# Patient Record
Sex: Female | Born: 1970 | Race: White | Hispanic: No | Marital: Married | Smoking: Former smoker
Health system: Southern US, Community
[De-identification: ages and names within clinical notes are randomized; demographics above are authoritative.]

## PROBLEM LIST (undated history)

## (undated) DIAGNOSIS — J069 Acute upper respiratory infection, unspecified: Secondary | ICD-10-CM

## (undated) DIAGNOSIS — D563 Thalassemia minor: Secondary | ICD-10-CM

## (undated) DIAGNOSIS — J329 Chronic sinusitis, unspecified: Secondary | ICD-10-CM

## (undated) DIAGNOSIS — M069 Rheumatoid arthritis, unspecified: Secondary | ICD-10-CM

## (undated) DIAGNOSIS — F419 Anxiety disorder, unspecified: Secondary | ICD-10-CM

## (undated) DIAGNOSIS — R002 Palpitations: Secondary | ICD-10-CM

## (undated) DIAGNOSIS — M2550 Pain in unspecified joint: Secondary | ICD-10-CM

## (undated) DIAGNOSIS — I10 Essential (primary) hypertension: Secondary | ICD-10-CM

## (undated) DIAGNOSIS — D649 Anemia, unspecified: Secondary | ICD-10-CM

## (undated) HISTORY — PX: THERAPEUTIC ABORTION: SHX798

## (undated) HISTORY — DX: Palpitations: R00.2

## (undated) HISTORY — DX: Anxiety disorder, unspecified: F41.9

## (undated) HISTORY — DX: Chronic sinusitis, unspecified: J32.9

## (undated) HISTORY — PX: WISDOM TOOTH EXTRACTION: SHX21

## (undated) HISTORY — DX: Acute upper respiratory infection, unspecified: J06.9

## (undated) HISTORY — PX: TONSILLECTOMY: SUR1361

## (undated) HISTORY — PX: KIDNEY SURGERY: SHX687

## (undated) HISTORY — DX: Pain in unspecified joint: M25.50

---

## 1998-01-21 ENCOUNTER — Other Ambulatory Visit: Admission: RE | Admit: 1998-01-21 | Discharge: 1998-01-21 | Payer: Self-pay | Admitting: Obstetrics and Gynecology

## 1998-02-25 ENCOUNTER — Ambulatory Visit (HOSPITAL_COMMUNITY): Admission: RE | Admit: 1998-02-25 | Discharge: 1998-02-25 | Payer: Self-pay | Admitting: Urology

## 1998-02-25 ENCOUNTER — Encounter: Payer: Self-pay | Admitting: Urology

## 1998-12-31 ENCOUNTER — Encounter: Payer: Self-pay | Admitting: *Deleted

## 1998-12-31 ENCOUNTER — Ambulatory Visit (HOSPITAL_COMMUNITY): Admission: RE | Admit: 1998-12-31 | Discharge: 1998-12-31 | Payer: Self-pay | Admitting: *Deleted

## 1999-02-04 ENCOUNTER — Other Ambulatory Visit: Admission: RE | Admit: 1999-02-04 | Discharge: 1999-02-04 | Payer: Self-pay | Admitting: Obstetrics and Gynecology

## 2000-02-03 ENCOUNTER — Other Ambulatory Visit: Admission: RE | Admit: 2000-02-03 | Discharge: 2000-02-03 | Payer: Self-pay | Admitting: Obstetrics and Gynecology

## 2001-03-03 ENCOUNTER — Other Ambulatory Visit: Admission: RE | Admit: 2001-03-03 | Discharge: 2001-03-03 | Payer: Self-pay | Admitting: Obstetrics and Gynecology

## 2002-03-26 ENCOUNTER — Other Ambulatory Visit: Admission: RE | Admit: 2002-03-26 | Discharge: 2002-03-26 | Payer: Self-pay | Admitting: Obstetrics and Gynecology

## 2002-09-14 ENCOUNTER — Ambulatory Visit (HOSPITAL_COMMUNITY): Admission: RE | Admit: 2002-09-14 | Discharge: 2002-09-14 | Payer: Self-pay | Admitting: Urology

## 2002-09-14 ENCOUNTER — Encounter: Payer: Self-pay | Admitting: Urology

## 2003-01-11 ENCOUNTER — Emergency Department (HOSPITAL_COMMUNITY): Admission: EM | Admit: 2003-01-11 | Discharge: 2003-01-11 | Payer: Self-pay | Admitting: Emergency Medicine

## 2003-01-14 ENCOUNTER — Emergency Department (HOSPITAL_COMMUNITY): Admission: EM | Admit: 2003-01-14 | Discharge: 2003-01-14 | Payer: Self-pay

## 2003-01-18 ENCOUNTER — Emergency Department (HOSPITAL_COMMUNITY): Admission: EM | Admit: 2003-01-18 | Discharge: 2003-01-18 | Payer: Self-pay | Admitting: Emergency Medicine

## 2003-01-25 ENCOUNTER — Emergency Department (HOSPITAL_COMMUNITY): Admission: EM | Admit: 2003-01-25 | Discharge: 2003-01-25 | Payer: Self-pay | Admitting: Emergency Medicine

## 2003-02-01 ENCOUNTER — Emergency Department (HOSPITAL_COMMUNITY): Admission: EM | Admit: 2003-02-01 | Discharge: 2003-02-01 | Payer: Self-pay | Admitting: Emergency Medicine

## 2003-04-19 ENCOUNTER — Other Ambulatory Visit: Admission: RE | Admit: 2003-04-19 | Discharge: 2003-04-19 | Payer: Self-pay | Admitting: Obstetrics and Gynecology

## 2004-05-28 ENCOUNTER — Other Ambulatory Visit: Admission: RE | Admit: 2004-05-28 | Discharge: 2004-05-28 | Payer: Self-pay | Admitting: Obstetrics and Gynecology

## 2005-06-23 ENCOUNTER — Other Ambulatory Visit: Admission: RE | Admit: 2005-06-23 | Discharge: 2005-06-23 | Payer: Self-pay | Admitting: Obstetrics and Gynecology

## 2007-08-30 ENCOUNTER — Encounter: Admission: RE | Admit: 2007-08-30 | Discharge: 2007-08-30 | Payer: Self-pay | Admitting: Obstetrics and Gynecology

## 2008-03-01 ENCOUNTER — Encounter: Admission: RE | Admit: 2008-03-01 | Discharge: 2008-03-01 | Payer: Self-pay | Admitting: Obstetrics and Gynecology

## 2010-02-28 ENCOUNTER — Inpatient Hospital Stay (HOSPITAL_COMMUNITY): Admission: AD | Admit: 2010-02-28 | Discharge: 2010-02-28 | Payer: Self-pay | Admitting: Obstetrics and Gynecology

## 2010-03-14 ENCOUNTER — Inpatient Hospital Stay (HOSPITAL_COMMUNITY): Admission: AD | Admit: 2010-03-14 | Discharge: 2010-03-18 | Payer: Self-pay | Admitting: Obstetrics and Gynecology

## 2010-03-15 ENCOUNTER — Encounter (INDEPENDENT_AMBULATORY_CARE_PROVIDER_SITE_OTHER): Payer: Self-pay | Admitting: Obstetrics and Gynecology

## 2010-03-16 ENCOUNTER — Encounter
Admission: RE | Admit: 2010-03-16 | Discharge: 2010-04-15 | Payer: Self-pay | Source: Home / Self Care | Attending: Obstetrics and Gynecology | Admitting: Obstetrics and Gynecology

## 2010-04-16 ENCOUNTER — Encounter
Admission: RE | Admit: 2010-04-16 | Discharge: 2010-05-16 | Payer: Self-pay | Source: Home / Self Care | Attending: Obstetrics and Gynecology | Admitting: Obstetrics and Gynecology

## 2010-05-17 ENCOUNTER — Encounter
Admission: RE | Admit: 2010-05-17 | Discharge: 2010-06-02 | Payer: Self-pay | Source: Home / Self Care | Attending: Obstetrics and Gynecology | Admitting: Obstetrics and Gynecology

## 2010-05-24 ENCOUNTER — Encounter: Payer: Self-pay | Admitting: Obstetrics and Gynecology

## 2010-06-17 ENCOUNTER — Encounter (HOSPITAL_COMMUNITY)
Admission: RE | Admit: 2010-06-17 | Discharge: 2010-06-17 | Disposition: A | Discharge status: Home / Self Care | Payer: Self-pay | Source: Ambulatory Visit | Attending: Obstetrics and Gynecology | Admitting: Obstetrics and Gynecology

## 2010-06-17 DIAGNOSIS — O923 Agalactia: Secondary | ICD-10-CM | POA: Insufficient documentation

## 2010-07-14 LAB — CBC
HCT: 25.2 % — ABNORMAL LOW (ref 36.0–46.0)
HCT: 25.8 % — ABNORMAL LOW (ref 36.0–46.0)
Hemoglobin: 8.1 g/dL — ABNORMAL LOW (ref 12.0–15.0)
Hemoglobin: 8.3 g/dL — ABNORMAL LOW (ref 12.0–15.0)
MCH: 21.7 pg — ABNORMAL LOW (ref 26.0–34.0)
MCHC: 32.2 g/dL (ref 30.0–36.0)
RBC: 3.69 MIL/uL — ABNORMAL LOW (ref 3.87–5.11)
RBC: 3.81 MIL/uL — ABNORMAL LOW (ref 3.87–5.11)
RDW: 15.1 % (ref 11.5–15.5)
RDW: 15.6 % — ABNORMAL HIGH (ref 11.5–15.5)
WBC: 10.2 10*3/uL (ref 4.0–10.5)
WBC: 12.3 10*3/uL — ABNORMAL HIGH (ref 4.0–10.5)

## 2010-07-14 LAB — COMPREHENSIVE METABOLIC PANEL
ALT: 16 U/L (ref 0–35)
AST: 29 U/L (ref 0–37)
Albumin: 3 g/dL — ABNORMAL LOW (ref 3.5–5.2)
Alkaline Phosphatase: 126 U/L — ABNORMAL HIGH (ref 39–117)
Calcium: 9.8 mg/dL (ref 8.4–10.5)
GFR calc Af Amer: >60 mL/min (ref >60)
Potassium: 4.5 mEq/L (ref 3.5–5.1)
Sodium: 135 mEq/L (ref 135–145)
Total Protein: 6.2 g/dL (ref 6.0–8.3)

## 2010-07-14 LAB — ABO/RH: ABO/RH(D): O POS

## 2010-07-18 ENCOUNTER — Encounter (HOSPITAL_COMMUNITY)
Admission: RE | Admit: 2010-07-18 | Discharge: 2010-07-18 | Disposition: A | Discharge status: Home / Self Care | Payer: Self-pay | Source: Ambulatory Visit | Attending: Obstetrics and Gynecology | Admitting: Obstetrics and Gynecology

## 2010-07-18 DIAGNOSIS — O923 Agalactia: Secondary | ICD-10-CM | POA: Insufficient documentation

## 2010-08-18 ENCOUNTER — Encounter (HOSPITAL_COMMUNITY)
Admission: RE | Admit: 2010-08-18 | Discharge: 2010-08-18 | Disposition: A | Discharge status: Home / Self Care | Payer: Self-pay | Source: Ambulatory Visit | Attending: Obstetrics and Gynecology | Admitting: Obstetrics and Gynecology

## 2010-08-18 DIAGNOSIS — O923 Agalactia: Secondary | ICD-10-CM | POA: Insufficient documentation

## 2010-09-18 ENCOUNTER — Encounter (HOSPITAL_COMMUNITY)
Admission: RE | Admit: 2010-09-18 | Discharge: 2010-09-18 | Disposition: A | Discharge status: Home / Self Care | Payer: Self-pay | Source: Ambulatory Visit | Attending: Obstetrics and Gynecology | Admitting: Obstetrics and Gynecology

## 2010-09-18 DIAGNOSIS — O923 Agalactia: Secondary | ICD-10-CM | POA: Insufficient documentation

## 2010-10-19 ENCOUNTER — Encounter (HOSPITAL_COMMUNITY)
Admission: RE | Admit: 2010-10-19 | Discharge: 2010-10-19 | Disposition: A | Discharge status: Home / Self Care | Payer: Self-pay | Source: Ambulatory Visit | Attending: Obstetrics and Gynecology | Admitting: Obstetrics and Gynecology

## 2010-10-19 DIAGNOSIS — O923 Agalactia: Secondary | ICD-10-CM | POA: Insufficient documentation

## 2010-11-19 ENCOUNTER — Encounter (HOSPITAL_COMMUNITY)
Admission: RE | Admit: 2010-11-19 | Discharge: 2010-11-19 | Disposition: A | Discharge status: Home / Self Care | Payer: Self-pay | Source: Ambulatory Visit | Attending: Obstetrics and Gynecology | Admitting: Obstetrics and Gynecology

## 2010-11-19 DIAGNOSIS — O923 Agalactia: Secondary | ICD-10-CM | POA: Insufficient documentation

## 2010-12-20 ENCOUNTER — Encounter (HOSPITAL_COMMUNITY)
Admission: RE | Admit: 2010-12-20 | Discharge: 2010-12-20 | Disposition: A | Discharge status: Home / Self Care | Payer: Self-pay | Source: Ambulatory Visit | Attending: Obstetrics and Gynecology | Admitting: Obstetrics and Gynecology

## 2010-12-20 DIAGNOSIS — O923 Agalactia: Secondary | ICD-10-CM | POA: Insufficient documentation

## 2011-01-20 ENCOUNTER — Encounter (HOSPITAL_COMMUNITY)
Admission: RE | Admit: 2011-01-20 | Discharge: 2011-01-20 | Disposition: A | Discharge status: Home / Self Care | Payer: Self-pay | Source: Ambulatory Visit | Attending: Obstetrics and Gynecology | Admitting: Obstetrics and Gynecology

## 2011-01-20 DIAGNOSIS — O923 Agalactia: Secondary | ICD-10-CM | POA: Insufficient documentation

## 2011-02-20 ENCOUNTER — Encounter (HOSPITAL_COMMUNITY)
Admission: RE | Admit: 2011-02-20 | Discharge: 2011-02-20 | Disposition: A | Discharge status: Home / Self Care | Payer: Self-pay | Source: Ambulatory Visit | Attending: Obstetrics and Gynecology | Admitting: Obstetrics and Gynecology

## 2011-02-20 DIAGNOSIS — O923 Agalactia: Secondary | ICD-10-CM | POA: Insufficient documentation

## 2011-03-23 ENCOUNTER — Encounter (HOSPITAL_COMMUNITY)
Admission: RE | Admit: 2011-03-23 | Discharge: 2011-03-23 | Disposition: A | Discharge status: Home / Self Care | Payer: Self-pay | Source: Ambulatory Visit | Attending: Obstetrics and Gynecology | Admitting: Obstetrics and Gynecology

## 2011-03-23 DIAGNOSIS — O923 Agalactia: Secondary | ICD-10-CM | POA: Insufficient documentation

## 2011-04-23 ENCOUNTER — Encounter (HOSPITAL_COMMUNITY)
Admission: RE | Admit: 2011-04-23 | Discharge: 2011-04-23 | Disposition: A | Discharge status: Home / Self Care | Payer: Self-pay | Source: Ambulatory Visit | Attending: Obstetrics and Gynecology | Admitting: Obstetrics and Gynecology

## 2011-04-23 DIAGNOSIS — O923 Agalactia: Secondary | ICD-10-CM | POA: Insufficient documentation

## 2011-05-24 ENCOUNTER — Encounter (HOSPITAL_COMMUNITY)
Admission: RE | Admit: 2011-05-24 | Discharge: 2011-05-24 | Disposition: A | Discharge status: Home / Self Care | Payer: Self-pay | Source: Ambulatory Visit | Attending: Obstetrics and Gynecology | Admitting: Obstetrics and Gynecology

## 2011-05-24 DIAGNOSIS — O923 Agalactia: Secondary | ICD-10-CM | POA: Insufficient documentation

## 2011-06-24 ENCOUNTER — Encounter (HOSPITAL_COMMUNITY)
Admission: RE | Admit: 2011-06-24 | Discharge: 2011-06-24 | Disposition: A | Discharge status: Home / Self Care | Payer: Self-pay | Source: Ambulatory Visit | Attending: Obstetrics and Gynecology | Admitting: Obstetrics and Gynecology

## 2011-06-24 DIAGNOSIS — O923 Agalactia: Secondary | ICD-10-CM | POA: Insufficient documentation

## 2011-07-24 ENCOUNTER — Encounter (HOSPITAL_COMMUNITY)
Admission: RE | Admit: 2011-07-24 | Discharge: 2011-07-24 | Disposition: A | Discharge status: Home / Self Care | Payer: Self-pay | Source: Ambulatory Visit | Attending: Obstetrics and Gynecology | Admitting: Obstetrics and Gynecology

## 2011-07-24 DIAGNOSIS — O923 Agalactia: Secondary | ICD-10-CM | POA: Insufficient documentation

## 2011-12-13 ENCOUNTER — Encounter: Payer: Self-pay | Admitting: *Deleted

## 2011-12-14 ENCOUNTER — Encounter: Payer: Self-pay | Admitting: *Deleted

## 2011-12-14 ENCOUNTER — Encounter: Payer: Self-pay | Admitting: Cardiovascular Disease

## 2011-12-14 DIAGNOSIS — J069 Acute upper respiratory infection, unspecified: Secondary | ICD-10-CM | POA: Insufficient documentation

## 2011-12-14 DIAGNOSIS — F419 Anxiety disorder, unspecified: Secondary | ICD-10-CM | POA: Insufficient documentation

## 2011-12-14 DIAGNOSIS — J329 Chronic sinusitis, unspecified: Secondary | ICD-10-CM | POA: Insufficient documentation

## 2011-12-14 DIAGNOSIS — M2550 Pain in unspecified joint: Secondary | ICD-10-CM | POA: Insufficient documentation

## 2011-12-14 DIAGNOSIS — R002 Palpitations: Secondary | ICD-10-CM | POA: Insufficient documentation

## 2011-12-15 ENCOUNTER — Institutional Professional Consult (permissible substitution): Payer: Self-pay | Admitting: Cardiovascular Disease

## 2012-01-06 ENCOUNTER — Ambulatory Visit (INDEPENDENT_AMBULATORY_CARE_PROVIDER_SITE_OTHER): Payer: 59 | Admitting: Cardiovascular Disease

## 2012-01-06 ENCOUNTER — Encounter: Payer: Self-pay | Admitting: Cardiovascular Disease

## 2012-01-06 VITALS — BP 160/104 | HR 100 | Ht 68.0 in | Wt 165.8 lb

## 2012-01-06 DIAGNOSIS — F419 Anxiety disorder, unspecified: Secondary | ICD-10-CM

## 2012-01-06 DIAGNOSIS — F411 Generalized anxiety disorder: Secondary | ICD-10-CM

## 2012-01-06 DIAGNOSIS — R002 Palpitations: Secondary | ICD-10-CM

## 2012-01-06 MED ORDER — PROPRANOLOL HCL 10 MG PO TABS: 10.0000 mg | ORAL_TABLET | Freq: Every day | ORAL | Status: AC | PRN

## 2012-01-06 NOTE — Progress Notes (Signed)
Patient ID: Dominique Harvey, female   DOB: 09/30/1970, 41 y.o.   MRN: [id number] Anxious 41 yo referred by palpitations.  History of panic attacks.  Husband works Designer, jewellery and is gone a month at a time.  Last month after he left she had increased palpitations.  No presynocpe.  Flip flops no sustained rapid episodes.  Has been on Effexor for a long time.  PRN xanax.  She gets anxious and then her body gets "jacked up".  Mom has MVP.  She has not been told about any murmurs.  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  No dyspnea, chest pain or syncope.  Denies drugs or excess cafeinne  ROS: Denies fever, malais, weight loss, blurry vision, decreased visual acuity, cough, sputum, SOB, hemoptysis, pleuritic pain, palpitaitons, heartburn, abdominal pain, melena, lower extremity edema, claudication, or rash.  All other systems reviewed and negative   General: Affect appropriate Healthy:  appears stated age HEENT: normal Neck supple with no adenopathy JVP normal no bruits no thyromegaly Lungs clear with no wheezing and good diaphragmatic motion Heart:  S1/S2 no murmur,rub, gallop or click PMI normal Abdomen: benighn, BS positve, no tenderness, no AAA no bruit.  No HSM or HJR Distal pulses intact with no bruits No edema Neuro non-focal Skin warm and dry No muscular weakness  Medications Current Outpatient Prescriptions  Medication Sig Dispense Refill  . ALPRAZolam (XANAX) 0.5 MG tablet Take 0.25 mg by mouth as needed.       . cetirizine (ZYRTEC) 10 MG tablet Take 10 mg by mouth daily.      . Multiple Vitamin (MULTIVITAMIN) capsule Take 1 capsule by mouth daily.      . valACYclovir (VALTREX) 1000 MG tablet Take 1,000 mg by mouth as directed.      . venlafaxine (EFFEXOR) 75 MG tablet 3 tabs po qd      . propranolol (INDERAL) 10 MG tablet Take 1 tablet (10 mg total) by mouth daily as needed.  30 tablet  3    Allergies Review of patient's  allergies indicates no known allergies.  Family History: Family History  Problem Relation Age of Onset  . Mitral valve prolapse Mother   . Atrial fibrillation Mother   . Cancer      `  . Hypertension      Social History: History   Social History  . Marital Status: Married    Spouse Name: N/A    Number of Children: N/A  . Years of Education: N/A   Occupational History  . Not on file.   Social History Main Topics  . Smoking status: Former Smoker -- 1.0 packs/day for 5 years    Types: Cigarettes    Quit date: 05/03/1992  . Smokeless tobacco: Not on file  . Alcohol Use: Not on file  . Drug Use: Not on file  . Sexually Active: Not on file   Other Topics Concern  . Not on file   Social History Narrative  . No narrative on file    Electrocardiogram:  NSR normal ECG  Assessment and Plan

## 2012-01-06 NOTE — Assessment & Plan Note (Signed)
Benign with normal exam and ECG  PRN Inderal for somatic component.   No need for any other cardiac testing

## 2012-01-06 NOTE — Patient Instructions (Signed)
Your physician recommends that you schedule a follow-up appointment in: AS NEEDED Your physician has recommended you make the following change in your medication: PROPRANOLOL 10 MG DAILY AS NEEDED FOR PALPITATION

## 2012-01-06 NOTE — Assessment & Plan Note (Signed)
PRN xanax.  Consider stopping Effexor due to sympathomimitic effect and switch to other med

## 2012-01-13 ENCOUNTER — Encounter: Payer: Self-pay | Admitting: Cardiovascular Disease

## 2012-08-25 ENCOUNTER — Other Ambulatory Visit: Payer: Self-pay | Admitting: Obstetrics and Gynecology

## 2015-01-17 ENCOUNTER — Other Ambulatory Visit: Payer: Self-pay

## 2015-01-17 DIAGNOSIS — Z1231 Encounter for screening mammogram for malignant neoplasm of breast: Secondary | ICD-10-CM

## 2015-01-22 ENCOUNTER — Ambulatory Visit: Payer: Self-pay

## 2015-02-20 ENCOUNTER — Ambulatory Visit: Payer: Self-pay

## 2015-02-28 ENCOUNTER — Ambulatory Visit
Admission: RE | Admit: 2015-02-28 | Discharge: 2015-02-28 | Disposition: A | Discharge status: Home / Self Care | Payer: Commercial Managed Care - HMO | Source: Ambulatory Visit

## 2015-02-28 DIAGNOSIS — Z1231 Encounter for screening mammogram for malignant neoplasm of breast: Secondary | ICD-10-CM

## 2015-05-07 ENCOUNTER — Other Ambulatory Visit: Payer: Self-pay | Admitting: Gastroenterology

## 2015-05-07 DIAGNOSIS — R7989 Other specified abnormal findings of blood chemistry: Secondary | ICD-10-CM

## 2015-05-07 DIAGNOSIS — R945 Abnormal results of liver function studies: Principal | ICD-10-CM

## 2015-05-15 ENCOUNTER — Other Ambulatory Visit: Payer: Commercial Managed Care - HMO

## 2015-05-16 ENCOUNTER — Other Ambulatory Visit: Payer: Commercial Managed Care - HMO

## 2015-05-23 ENCOUNTER — Ambulatory Visit
Admission: RE | Admit: 2015-05-23 | Discharge: 2015-05-23 | Disposition: A | Discharge status: Home / Self Care | Payer: Commercial Managed Care - HMO | Source: Ambulatory Visit | Attending: Gastroenterology | Admitting: Gastroenterology

## 2015-05-23 DIAGNOSIS — R7989 Other specified abnormal findings of blood chemistry: Secondary | ICD-10-CM

## 2015-05-23 DIAGNOSIS — R945 Abnormal results of liver function studies: Principal | ICD-10-CM

## 2016-02-09 ENCOUNTER — Other Ambulatory Visit: Payer: Self-pay | Admitting: Physician Assistant

## 2016-02-09 DIAGNOSIS — Z1231 Encounter for screening mammogram for malignant neoplasm of breast: Secondary | ICD-10-CM

## 2016-03-02 ENCOUNTER — Ambulatory Visit: Payer: Commercial Managed Care - HMO

## 2016-03-31 ENCOUNTER — Ambulatory Visit
Admission: RE | Admit: 2016-03-31 | Discharge: 2016-03-31 | Disposition: A | Discharge status: Home / Self Care | Payer: Managed Care, Other (non HMO) | Source: Ambulatory Visit | Attending: Physician Assistant | Admitting: Physician Assistant

## 2016-03-31 DIAGNOSIS — Z1231 Encounter for screening mammogram for malignant neoplasm of breast: Secondary | ICD-10-CM

## 2016-07-07 ENCOUNTER — Other Ambulatory Visit: Payer: Self-pay | Admitting: Rheumatology

## 2016-07-07 ENCOUNTER — Telehealth: Payer: Self-pay | Admitting: Rheumatology

## 2016-07-07 NOTE — Telephone Encounter (Signed)
Patient has an appointment on March 15 @ 8:45 with Mr. Leane Call.

## 2016-07-07 NOTE — Telephone Encounter (Signed)
-----   Message from Henriette Combs, LPN sent at 06/10/4130 11:07 AM EST ----- Regarding: Please schedule patient for follow up visit Please schedule patient for follow up visit. Patient was due January 2018. Thanks!

## 2016-07-07 NOTE — Telephone Encounter (Addendum)
Left message for patient to call the office.  Last Visit: 01/12/16 Next visit: 07/15/16 Labs: 01/09/16 /w previous Updated labs 07/08/16   Okay for 30 supply of Imuran?

## 2016-07-08 ENCOUNTER — Other Ambulatory Visit: Payer: Self-pay | Admitting: *Deleted

## 2016-07-08 DIAGNOSIS — M2550 Pain in unspecified joint: Secondary | ICD-10-CM

## 2016-07-08 DIAGNOSIS — Z79899 Other long term (current) drug therapy: Secondary | ICD-10-CM

## 2016-07-08 LAB — CBC WITH DIFFERENTIAL/PLATELET
BASOS PCT: 0 %
Basophils Absolute: 0 cells/uL (ref 0–200)
EOS PCT: 1 %
Eosinophils Absolute: 55 cells/uL (ref 15–500)
HCT: 35.3 % (ref 35.0–45.0)
Hemoglobin: 11 g/dL — ABNORMAL LOW (ref 11.7–15.5)
LYMPHS ABS: 495 {cells}/uL — AB (ref 850–3900)
LYMPHS PCT: 9 %
MCH: 20.4 pg — ABNORMAL LOW (ref 27.0–33.0)
MCHC: 31.2 g/dL — AB (ref 32.0–36.0)
MCV: 65.4 fL — ABNORMAL LOW (ref 80.0–100.0)
Monocytes Absolute: 660 cells/uL (ref 200–950)
Monocytes Relative: 12 %
NEUTROS PCT: 78 %
Neutro Abs: 4290 cells/uL (ref 1500–7800)
PLATELETS: 323 10*3/uL (ref 140–400)
RBC: 5.4 MIL/uL — ABNORMAL HIGH (ref 3.80–5.10)
RDW: 17.5 % — AB (ref 11.0–15.0)
WBC: 5.5 10*3/uL (ref 3.8–10.8)

## 2016-07-08 LAB — COMPLETE METABOLIC PANEL WITH GFR
ALT: 9 U/L (ref 6–29)
AST: 15 U/L (ref 10–30)
Albumin: 4.4 g/dL (ref 3.6–5.1)
Alkaline Phosphatase: 73 U/L (ref 33–115)
BUN: 16 mg/dL (ref 7–25)
CHLORIDE: 106 mmol/L (ref 98–110)
CO2: 22 mmol/L (ref 20–31)
Calcium: 9.1 mg/dL (ref 8.6–10.2)
Creat: 0.71 mg/dL (ref 0.50–1.10)
GFR, Est African American: >89 mL/min (ref >=60)
GFR, Est Non African American: >89 mL/min (ref >=60)
GLUCOSE: 76 mg/dL (ref 65–99)
POTASSIUM: 4.1 mmol/L (ref 3.5–5.3)
SODIUM: 137 mmol/L (ref 135–146)
Total Bilirubin: 0.4 mg/dL (ref 0.2–1.2)
Total Protein: 7.3 g/dL (ref 6.1–8.1)

## 2016-07-09 LAB — RHEUMATOID FACTOR: Rhuematoid fact SerPl-aCnc: 60 IU/mL — ABNORMAL HIGH (ref <14)

## 2016-07-12 LAB — PROTEIN ELECTROPHORESIS, SERUM
ALBUMIN ELP: 4.2 g/dL (ref 3.8–4.8)
ALPHA-1-GLOBULIN: 0.3 g/dL (ref 0.2–0.3)
ALPHA-2-GLOBULIN: 0.6 g/dL (ref 0.5–0.9)
Beta 2: 0.4 g/dL (ref 0.2–0.5)
Beta Globulin: 0.5 g/dL (ref 0.4–0.6)
GAMMA GLOBULIN: 1.4 g/dL (ref 0.8–1.7)
TOTAL PROTEIN, SERUM ELECTROPHOR: 7.3 g/dL (ref 6.1–8.1)

## 2016-07-14 DIAGNOSIS — Z87442 Personal history of urinary calculi: Secondary | ICD-10-CM | POA: Insufficient documentation

## 2016-07-14 DIAGNOSIS — Z8679 Personal history of other diseases of the circulatory system: Secondary | ICD-10-CM | POA: Insufficient documentation

## 2016-07-14 DIAGNOSIS — Z8619 Personal history of other infectious and parasitic diseases: Secondary | ICD-10-CM | POA: Insufficient documentation

## 2016-07-14 DIAGNOSIS — D563 Thalassemia minor: Secondary | ICD-10-CM | POA: Insufficient documentation

## 2016-07-14 DIAGNOSIS — M329 Systemic lupus erythematosus, unspecified: Secondary | ICD-10-CM | POA: Insufficient documentation

## 2016-07-14 DIAGNOSIS — Z79899 Other long term (current) drug therapy: Secondary | ICD-10-CM | POA: Insufficient documentation

## 2016-07-14 DIAGNOSIS — Z862 Personal history of diseases of the blood and blood-forming organs and certain disorders involving the immune mechanism: Secondary | ICD-10-CM | POA: Insufficient documentation

## 2016-07-14 DIAGNOSIS — M0579 Rheumatoid arthritis with rheumatoid factor of multiple sites without organ or systems involvement: Secondary | ICD-10-CM | POA: Insufficient documentation

## 2016-07-14 DIAGNOSIS — M3500 Sicca syndrome, unspecified: Secondary | ICD-10-CM

## 2016-07-14 DIAGNOSIS — M359 Systemic involvement of connective tissue, unspecified: Secondary | ICD-10-CM | POA: Insufficient documentation

## 2016-07-14 DIAGNOSIS — Z8659 Personal history of other mental and behavioral disorders: Secondary | ICD-10-CM | POA: Insufficient documentation

## 2016-07-14 NOTE — Progress Notes (Signed)
Office Visit Note  Patient: Dominique Harvey             Date of Birth: 05-08-1970           MRN: [id number]             PCP: Charolette Forward, PA-C Referring: No ref. provider found Visit Date: 07/15/2016 Occupation: '@GUAROCC' @    Subjective:  Follow-up autoimmune disease, rheumatoid arthritis, Sjogren's  History of Present Illness: Dominique Harvey is a 46 y.o. female  Last seen 01/12/2016. Patient states that she is doing really well. No joint pain swelling and stiffness. She did have a minor flare with the change of weather that included discomfort to her MCPs bilaterally and MTPs bilaterally.  She's taking Imuran 100 mg daily without any problem.  She's had recent labs done in February and all labs were normal except she has some anemia. She Has a history of thalassemia which is the reason for her anemia. She had bilateral knee joint pain in the past which is all better now. She had bilateral elbow joint pain in the past which is now all better.  On the last visit in September, we had discussed increasing the Imuran 2 mg nightly but we don't need to do that at this time secondary to patient doing so well. She will coordinate her care with our office if she has any flares and if we need to increase the Imuran in the future.   Activities of Daily Living:  Patient reports morning stiffness for 5 minutes.   Patient Denies nocturnal pain.  Difficulty dressing/grooming: Denies Difficulty climbing stairs: Denies Difficulty getting out of chair: Denies Difficulty using hands for taps, buttons, cutlery, and/or writing: Denies   Review of Systems  Constitutional: Negative for fatigue.  HENT: Negative for mouth sores and mouth dryness.   Eyes: Negative for dryness.  Respiratory: Negative for shortness of breath.   Gastrointestinal: Negative for constipation and diarrhea.  Musculoskeletal: Negative for myalgias and myalgias.  Skin: Negative for sensitivity to  sunlight.  Psychiatric/Behavioral: Negative for decreased concentration and sleep disturbance.    PMFS History:  Patient Active Problem List   Diagnosis Date Noted  . Autoimmune disease ([place]) 07/14/2016  . Rheumatoid arthritis involving multiple sites with positive rheumatoid factor ([place]) 07/14/2016  . SLE-Sjogren overlap syndrome ([place]) 07/14/2016  . High risk medication use 07/14/2016  . History of anemia 07/14/2016  . History of hypertension 07/14/2016  . History of anxiety 07/14/2016  . History of nephrolithiasis 07/14/2016  . Thalassemia minor 07/14/2016  . History of herpes genitalis 07/14/2016  . Palpitations   . Sinusitis   . Joint pain   . Upper respiratory infection   . Anxiety     Past Medical History:  Diagnosis Date  . Anxiety   . Joint pain   . Palpitations   . Sinusitis   . Upper respiratory infection     Family History  Problem Relation Age of Onset  . Mitral valve prolapse Mother   . Atrial fibrillation Mother   . Cancer      `  . Hypertension     No past surgical history on file. Social History   Social History Narrative  . No narrative on file     Objective: Vital Signs: BP (!) 126/99   Pulse (!) 103   Resp 16   Ht '5\' 8"'  (1.727 m)   Wt 182 lb (82.6 kg)   BMI 27.67 kg/m    Physical  Exam  Constitutional: She is oriented to person, place, and time. She appears well-developed and well-nourished.  HENT:  Head: Normocephalic and atraumatic.  Eyes: EOM are normal. Pupils are equal, round, and reactive to light.  Cardiovascular: Normal rate, regular rhythm and normal heart sounds.  Exam reveals no gallop and no friction rub.   No murmur heard. Pulmonary/Chest: Effort normal and breath sounds normal. She has no wheezes. She has no rales.  Abdominal: Soft. Bowel sounds are normal. She exhibits no distension. There is no tenderness. There is no guarding. No hernia.  Musculoskeletal: Normal range of motion. She exhibits no edema, tenderness or  deformity.  Lymphadenopathy:    She has no cervical adenopathy.  Neurological: She is alert and oriented to person, place, and time. Coordination normal.  Skin: Skin is warm and dry. Capillary refill takes less than 2 seconds. No rash noted.  Psychiatric: She has a normal mood and affect. Her behavior is normal.  Nursing note and vitals reviewed.    Musculoskeletal Exam:  Full range of motion of all joints Grip strength is equal and strong bilaterally fibromyalgia tender points are all absent  CDAI Exam: CDAI Homunculus Exam:   Joint Counts:  CDAI Tender Joint count: 0 CDAI Swollen Joint count: 0  Global Assessments:  Patient Global Assessment: 0 Provider Global Assessment: 0    Investigation: Findings:  Labs from January 08, 2016 show CMP with GFR is normal, and CBC with differential is normal except for moderate anemia. The last autoimmune work-up was in March 2017  Orders Only on 07/08/2016  Component Date Value Ref Range Status  . WBC 07/08/2016 5.5  3.8 - 10.8 K/uL Final  . RBC 07/08/2016 5.40* 3.80 - 5.10 MIL/uL Final  . Hemoglobin 07/08/2016 11.0* 11.7 - 15.5 g/dL Final  . HCT 07/08/2016 35.3  35.0 - 45.0 % Final  . MCV 07/08/2016 65.4* 80.0 - 100.0 fL Final  . MCH 07/08/2016 20.4* 27.0 - 33.0 pg Final  . MCHC 07/08/2016 31.2* 32.0 - 36.0 g/dL Final  . RDW 07/08/2016 17.5* 11.0 - 15.0 % Final  . Platelets 07/08/2016 323  140 - 400 K/uL Final  . Neutro Abs 07/08/2016 4290  1,500 - 7,800 cells/uL Final  . Lymphs Abs 07/08/2016 495* 850 - 3,900 cells/uL Final  . Monocytes Absolute 07/08/2016 660  200 - 950 cells/uL Final  . Eosinophils Absolute 07/08/2016 55  15 - 500 cells/uL Final  . Basophils Absolute 07/08/2016 0  0 - 200 cells/uL Final  . Neutrophils Relative % 07/08/2016 78  % Final  . Lymphocytes Relative 07/08/2016 9  % Final  . Monocytes Relative 07/08/2016 12  % Final  . Eosinophils Relative 07/08/2016 1  % Final  . Basophils Relative 07/08/2016 0  %  Final  . Smear Review 07/08/2016 Criteria for review not met   Final  . Sodium 07/08/2016 137  135 - 146 mmol/L Final  . Potassium 07/08/2016 4.1  3.5 - 5.3 mmol/L Final  . Chloride 07/08/2016 106  98 - 110 mmol/L Final  . CO2 07/08/2016 22  20 - 31 mmol/L Final  . Glucose, Bld 07/08/2016 76  65 - 99 mg/dL Final  . BUN 07/08/2016 16  7 - 25 mg/dL Final  . Creat 07/08/2016 0.71  0.50 - 1.10 mg/dL Final  . Total Bilirubin 07/08/2016 0.4  0.2 - 1.2 mg/dL Final  . Alkaline Phosphatase 07/08/2016 73  33 - 115 U/L Final  . AST 07/08/2016 15  10 - 30 U/L  Final  . ALT 07/08/2016 9  6 - 29 U/L Final  . Total Protein 07/08/2016 7.3  6.1 - 8.1 g/dL Final  . Albumin 07/08/2016 4.4  3.6 - 5.1 g/dL Final  . Calcium 07/08/2016 9.1  8.6 - 10.2 mg/dL Final  . GFR, Est African American 07/08/2016 >89  >=60 mL/min Final  . GFR, Est Non African American 07/08/2016 >89  >=60 mL/min Final  . Rhuematoid fact SerPl-aCnc 07/08/2016 60* <14 IU/mL Final  . Total Protein, Serum Electrophores* 07/08/2016 7.3  6.1 - 8.1 g/dL Final  . Albumin ELP 07/08/2016 4.2  3.8 - 4.8 g/dL Final  . Alpha-1-Globulin 07/08/2016 0.3  0.2 - 0.3 g/dL Final  . Alpha-2-Globulin 07/08/2016 0.6  0.5 - 0.9 g/dL Final  . Beta Globulin 07/08/2016 0.5  0.4 - 0.6 g/dL Final  . Beta 2 07/08/2016 0.4  0.2 - 0.5 g/dL Final  . Gamma Globulin 07/08/2016 1.4  0.8 - 1.7 g/dL Final  . Abnormal Protein Band1 07/08/2016 NOT DET  g/dL Final  . SPE Interp. 07/08/2016 SEE NOTE   Final   Comment: Normal Electrophoretic Pattern Reviewed by Odis Hollingshead, MD, PhD, FCAP (Electronic Signature on File)   . Abnormal Protein Band2 07/08/2016 NOT DET  g/dL Final  . Abnormal Protein Band3 07/08/2016 NOT DET  g/dL Final      Imaging: No results found.  Speciality Comments: No specialty comments available.    Procedures:  No procedures performed Allergies: Patient has no known allergies.   Assessment / Plan:     Visit Diagnoses: Autoimmune  disease ([place]) - +ANA,Ro,Rheumatoid factor, & 14-3-3 eta & a history of sicca symptoms.  - Plan: Urinalysis, Routine w reflex microscopic, ANA, C3 and C4, Sedimentation rate  Rheumatoid arthritis involving multiple sites with positive rheumatoid factor ([place]) - Plan: Urinalysis, Routine w reflex microscopic, ANA, C3 and C4, Sedimentation rate  SLE-Sjogren overlap syndrome ([facility])  High risk medication use - Imuran-165m q night  - Plan: CBC with Differential/Platelet, Comprehensive metabolic panel, Urinalysis, Routine w reflex microscopic, ANA, C3 and C4, Sedimentation rate  History of anemia - From thalassemia  Arthralgia of both knees - consistent with osteoarthritis  Arthralgia of both elbows - consistent with left lateral epicondylitis of the left arm.  History of hypertension  History of anxiety  History of nephrolithiasis  Thalassemia minor  History of herpes genitalis   Plan: #1: Autoimmune disease. Doing well currently. No complaint. History of positive ANA, Ro, rheumatoid factor, 14-3 3 at a. History of sicca symptoms. Doing well. She is mostly bothered by dry mouth. Over-the-counter products help. She had a minor flare with her MCPs and MTPs slightly tender/uncomfortable with change of weather. Currently she is all better.  #2: History of rheumatoid arthritis/Sjogren's overlap syndrome. Doing well. See above for full details.  #3: High-risk prescription. 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 On Imuran 100 mg daily. Doing well. She needs a refill on this medication which we have provided.  #4: Bilateral knee joint pain is all better now.  #5: Bilateral elbow joint pain is all better as well this time.  #6: She's had labs done in February which were normal. We will order additional labs which she will start in May 2018. They will be CBC with differential, CMP with GFR, ANA with titer, urinalysis, C3-C4, sedimentation rate.  #7: Return to clinic in 4  months  Orders: Orders Placed This Encounter  Procedures  . CBC with Differential/Platelet  . Comprehensive metabolic panel  .  Urinalysis, Routine w reflex microscopic  . ANA  . C3 and C4  . Sedimentation rate   Meds ordered this encounter  Medications  . azaTHIOprine (IMURAN) 50 MG tablet    Sig: Take 2 tablets (100 mg total) by mouth at bedtime.    Dispense:  180 tablet    Refill:  0    Order Specific Question:   Supervising Provider    Answer:   Bo Merino [phone]    Face-to-face time spent with patient was 30 minutes. 50% of time was spent in counseling and coordination of care.  Follow-Up Instructions: Return in about 4 months (around 11/14/2016).   Eliezer Lofts, PA-C I examined and evaluated the patient with Eliezer Lofts PA. The plan of care was discussed as noted above.  Bo Merino, MD Note - This record has been created using Editor, commissioning.  Chart creation errors have been sought, but may not always  have been located. Such creation errors do not reflect on  the standard of medical care.

## 2016-07-15 ENCOUNTER — Ambulatory Visit (INDEPENDENT_AMBULATORY_CARE_PROVIDER_SITE_OTHER): Payer: Managed Care, Other (non HMO) | Admitting: Rheumatology

## 2016-07-15 ENCOUNTER — Encounter: Payer: Self-pay | Admitting: Rheumatology

## 2016-07-15 VITALS — BP 126/99 | HR 103 | Resp 16 | Ht 68.0 in | Wt 182.0 lb

## 2016-07-15 DIAGNOSIS — M25521 Pain in right elbow: Secondary | ICD-10-CM

## 2016-07-15 DIAGNOSIS — Z79899 Other long term (current) drug therapy: Secondary | ICD-10-CM | POA: Diagnosis not present

## 2016-07-15 DIAGNOSIS — Z862 Personal history of diseases of the blood and blood-forming organs and certain disorders involving the immune mechanism: Secondary | ICD-10-CM

## 2016-07-15 DIAGNOSIS — Z8619 Personal history of other infectious and parasitic diseases: Secondary | ICD-10-CM | POA: Diagnosis not present

## 2016-07-15 DIAGNOSIS — M359 Systemic involvement of connective tissue, unspecified: Secondary | ICD-10-CM

## 2016-07-15 DIAGNOSIS — M25561 Pain in right knee: Secondary | ICD-10-CM

## 2016-07-15 DIAGNOSIS — Z8659 Personal history of other mental and behavioral disorders: Secondary | ICD-10-CM

## 2016-07-15 DIAGNOSIS — D563 Thalassemia minor: Secondary | ICD-10-CM | POA: Diagnosis not present

## 2016-07-15 DIAGNOSIS — M25562 Pain in left knee: Secondary | ICD-10-CM

## 2016-07-15 DIAGNOSIS — Z87442 Personal history of urinary calculi: Secondary | ICD-10-CM

## 2016-07-15 DIAGNOSIS — Z8679 Personal history of other diseases of the circulatory system: Secondary | ICD-10-CM | POA: Diagnosis not present

## 2016-07-15 DIAGNOSIS — M329 Systemic lupus erythematosus, unspecified: Secondary | ICD-10-CM

## 2016-07-15 DIAGNOSIS — M25522 Pain in left elbow: Secondary | ICD-10-CM

## 2016-07-15 DIAGNOSIS — M3500 Sicca syndrome, unspecified: Secondary | ICD-10-CM

## 2016-07-15 DIAGNOSIS — M0579 Rheumatoid arthritis with rheumatoid factor of multiple sites without organ or systems involvement: Secondary | ICD-10-CM

## 2016-07-15 MED ORDER — AZATHIOPRINE 50 MG PO TABS: 100.0000 mg | ORAL_TABLET | Freq: Every day | ORAL | 0 refills | Status: DC

## 2016-07-15 NOTE — Patient Instructions (Signed)
Knee Exercises Ask your health care provider which exercises are safe for you. Do exercises exactly as told by your health care provider and adjust them as directed. It is normal to feel mild stretching, pulling, tightness, or discomfort as you do these exercises, but you should stop right away if you feel sudden pain or your pain gets worse.Do not begin these exercises until told by your health care provider. STRETCHING AND RANGE OF MOTION EXERCISES  These exercises warm up your muscles and joints and improve the movement and flexibility of your knee. These exercises also help to relieve pain, numbness, and tingling. Exercise A: Knee Extension, Prone  1. Lie on your abdomen on a bed. 2. Place your left / right knee just beyond the edge of the surface so your knee is not on the bed. You can put a towel under your left / right thigh just above your knee for comfort. 3. Relax your leg muscles and allow gravity to straighten your knee. You should feel a stretch behind your left / right knee. 4. Hold this position for __________ seconds. 5. Scoot up so your knee is supported between repetitions. Repeat __________ times. Complete this stretch __________ times a day. Exercise B: Knee Flexion, Active   1. Lie on your back with both knees straight. If this causes back discomfort, bend your left / right knee so your foot is flat on the floor. 2. Slowly slide your left / right heel back toward your buttocks until you feel a gentle stretch in the front of your knee or thigh. 3. Hold this position for __________ seconds. 4. Slowly slide your left / right heel back to the starting position. Repeat __________ times. Complete this exercise __________ times a day. Exercise C: Quadriceps, Prone   1. Lie on your abdomen on a firm surface, such as a bed or padded floor. 2. Bend your left / right knee and hold your ankle. If you cannot reach your ankle or pant leg, loop a belt around your foot and grab the belt  instead. 3. Gently pull your heel toward your buttocks. Your knee should not slide out to the side. You should feel a stretch in the front of your thigh and knee. 4. Hold this position for __________ seconds. Repeat __________ times. Complete this stretch __________ times a day. Exercise D: Hamstring, Supine  1. Lie on your back. 2. Loop a belt or towel over the ball of your left / right foot. The ball of your foot is on the walking surface, right under your toes. 3. Straighten your left / right knee and slowly pull on the belt to raise your leg until you feel a gentle stretch behind your knee.  Do not let your left / right knee bend while you do this.  Keep your other leg flat on the floor. 4. Hold this position for __________ seconds. Repeat __________ times. Complete this stretch __________ times a day. STRENGTHENING EXERCISES  These exercises build strength and endurance in your knee. Endurance is the ability to use your muscles for a long time, even after they get tired. Exercise E: Quadriceps, Isometric   1. Lie on your back with your left / right leg extended and your other knee bent. Put a rolled towel or small pillow under your knee if told by your health care provider. 2. Slowly tense the muscles in the front of your left / right thigh. You should see your kneecap slide up toward your hip or see   increased dimpling just above the knee. This motion will push the back of the knee toward the floor. 3. For __________ seconds, keep the muscle as tight as you can without increasing your pain. 4. Relax the muscles slowly and completely. Repeat __________ times. Complete this exercise __________ times a day. Exercise F: Straight Leg Raises - Quadriceps  1. Lie on your back with your left / right leg extended and your other knee bent. 2. Tense the muscles in the front of your left / right thigh. You should see your kneecap slide up or see increased dimpling just above the knee. Your thigh  may even shake a bit. 3. Keep these muscles tight as you raise your leg 4-6 inches (10-15 cm) off the floor. Do not let your knee bend. 4. Hold this position for __________ seconds. 5. Keep these muscles tense as you lower your leg. 6. Relax your muscles slowly and completely after each repetition. Repeat __________ times. Complete this exercise __________ times a day. Exercise G: Hamstring, Isometric  1. Lie on your back on a firm surface. 2. Bend your left / right knee approximately __________ degrees. 3. Dig your left / right heel into the surface as if you are trying to pull it toward your buttocks. Tighten the muscles in the back of your thighs to dig as hard as you can without increasing any pain. 4. Hold this position for __________ seconds. 5. Release the tension gradually and allow your muscles to relax completely for __________ seconds after each repetition. Repeat __________ times. Complete this exercise __________ times a day. Exercise H: Hamstring Curls   If told by your health care provider, do this exercise while wearing ankle weights. Begin with __________ weights. Then increase the weight by 1 lb (0.5 kg) increments. Do not wear ankle weights that are more than __________. 1. Lie on your abdomen with your legs straight. 2. Bend your left / right knee as far as you can without feeling pain. Keep your hips flat against the floor. 3. Hold this position for __________ seconds. 4. Slowly lower your leg to the starting position. Repeat __________ times. Complete this exercise __________ times a day. Exercise I: Squats (Quadriceps)  1. Stand in front of a table, with your feet and knees pointing straight ahead. You may rest your hands on the table for balance but not for support. 2. Slowly bend your knees and lower your hips like you are going to sit in a chair.  Keep your weight over your heels, not over your toes.  Keep your lower legs upright so they are parallel with the  table legs.  Do not let your hips go lower than your knees.  Do not bend lower than told by your health care provider.  If your knee pain increases, do not bend as low. 3. Hold the squat position for __________ seconds. 4. Slowly push with your legs to return to standing. Do not use your hands to pull yourself to standing. Repeat __________ times. Complete this exercise __________ times a day. Exercise J: Wall Slides (Quadriceps)   1. Lean your back against a smooth wall or door while you walk your feet out 18-24 inches (46-61 cm) from it. 2. Place your feet hip-width apart. 3. Slowly slide down the wall or door until your knees Repeat __________ times. Complete this exercise __________ times a day. 4. Exercise K: Straight Leg Raises - Hip Abductors  1. Lie on your side with your left / right leg   in the top position. Lie so your head, shoulder, knee, and hip line up. You may bend your bottom knee to help you keep your balance. 2. Roll your hips slightly forward so your hips are stacked directly over each other and your left / right knee is facing forward. 3. Leading with your heel, lift your top leg 4-6 inches (10-15 cm). You should feel the muscles in your outer hip lifting.  Do not let your foot drift forward.  Do not let your knee roll toward the ceiling. 4. Hold this position for __________ seconds. 5. Slowly return your leg to the starting position. 6. Let your muscles relax completely after each repetition. Repeat __________ times. Complete this exercise __________ times a day. Exercise L: Straight Leg Raises - Hip Extensors  1. Lie on your abdomen on a firm surface. You can put a pillow under your hips if that is more comfortable. 2. Tense the muscles in your buttocks and lift your left / right leg about 4-6 inches (10-15 cm). Keep your knee straight as you lift your leg. 3. Hold this position for __________ seconds. 4. Slowly lower your leg to the starting position. 5. Let  your leg relax completely after each repetition. Repeat __________ times. Complete this exercise __________ times a day. This information is not intended to replace advice given to you by your health care provider. Make sure you discuss any questions you have with your health care provider. Document Released: 03/03/2005 Document Revised: 01/12/2016 Document Reviewed: 02/23/2015 Elsevier Interactive Patient Education  2017 [employer]  

## 2016-10-02 ENCOUNTER — Other Ambulatory Visit: Payer: Self-pay | Admitting: Rheumatology

## 2016-10-04 NOTE — Telephone Encounter (Signed)
Last Visit: 07/15/16 Next Visit: 11/17/16 Labs: 07/08/16 CBC/CMP moderate anemia (results faxed to PCP)  Okay to refill Imuran?

## 2016-11-17 ENCOUNTER — Ambulatory Visit: Payer: Managed Care, Other (non HMO) | Admitting: Rheumatology

## 2016-11-23 ENCOUNTER — Ambulatory Visit: Payer: Managed Care, Other (non HMO) | Admitting: Rheumatology

## 2017-01-09 ENCOUNTER — Other Ambulatory Visit: Payer: Self-pay | Admitting: Rheumatology

## 2017-01-10 NOTE — Telephone Encounter (Signed)
Last Visit: 07/15/16 Next Visit: 01/27/17 Labs: 07/08/16 moderate anemia rest WNL

## 2017-01-11 ENCOUNTER — Other Ambulatory Visit: Payer: Self-pay | Admitting: *Deleted

## 2017-01-11 DIAGNOSIS — Z79899 Other long term (current) drug therapy: Secondary | ICD-10-CM

## 2017-01-11 NOTE — Telephone Encounter (Signed)
Patient came for labs 01/11/17  Okay to refill per Dr. Corliss Skains

## 2017-01-12 LAB — COMPLETE METABOLIC PANEL WITH GFR
AG RATIO: 1.5 (calc) (ref 1.0–2.5)
ALT: 9 U/L (ref 6–29)
AST: 15 U/L (ref 10–30)
Albumin: 4.3 g/dL (ref 3.6–5.1)
Alkaline phosphatase (APISO): 72 U/L (ref 33–115)
BUN: 12 mg/dL (ref 7–25)
CO2: 28 mmol/L (ref 20–32)
Calcium: 9.2 mg/dL (ref 8.6–10.2)
Chloride: 105 mmol/L (ref 98–110)
Creat: 0.75 mg/dL (ref 0.50–1.10)
GFR, Est African American: 112 mL/min/{1.73_m2} (ref >=60)
GFR, Est Non African American: 97 mL/min/{1.73_m2} (ref >=60)
GLOBULIN: 2.9 g/dL (ref 1.9–3.7)
Glucose, Bld: 90 mg/dL (ref 65–99)
POTASSIUM: 4.5 mmol/L (ref 3.5–5.3)
SODIUM: 137 mmol/L (ref 135–146)
Total Bilirubin: 0.5 mg/dL (ref 0.2–1.2)
Total Protein: 7.2 g/dL (ref 6.1–8.1)

## 2017-01-12 LAB — CBC WITH DIFFERENTIAL/PLATELET
Basophils Absolute: 9 cells/uL (ref 0–200)
Basophils Relative: 0.2 %
Eosinophils Absolute: 81 cells/uL (ref 15–500)
Eosinophils Relative: 1.8 %
HEMATOCRIT: 34.1 % — AB (ref 35.0–45.0)
Hemoglobin: 10.5 g/dL — ABNORMAL LOW (ref 11.7–15.5)
Lymphs Abs: 432 cells/uL — ABNORMAL LOW (ref 850–3900)
MCH: 20.3 pg — ABNORMAL LOW (ref 27.0–33.0)
MCHC: 30.8 g/dL — ABNORMAL LOW (ref 32.0–36.0)
MCV: 66 fL — AB (ref 80.0–100.0)
MPV: 12 fL (ref 7.5–12.5)
Monocytes Relative: 12.8 %
NEUTROS PCT: 75.6 %
Neutro Abs: 3402 cells/uL (ref 1500–7800)
Platelets: 318 10*3/uL (ref 140–400)
RBC: 5.17 10*6/uL — AB (ref 3.80–5.10)
RDW: 15.7 % — AB (ref 11.0–15.0)
TOTAL LYMPHOCYTE: 9.6 %
WBC: 4.5 10*3/uL (ref 3.8–10.8)
WBCMIX: 576 {cells}/uL (ref 200–950)

## 2017-01-12 LAB — CBC MORPHOLOGY

## 2017-01-12 NOTE — Progress Notes (Signed)
Labs are stable.

## 2017-01-17 NOTE — Progress Notes (Deleted)
Office Visit Note  Patient: Dominique Harvey             Date of Birth: 12-10-1970           MRN: [id number]             PCP: Charlies Silvers, PA-C Referring: Charlies Silvers, [place]* Visit Date: 01/27/2017 Occupation: @GUAROCC @    Subjective:  No chief complaint on file.   History of Present Illness: Dominique Harvey is a 46 y.o. female ***   Activities of Daily Living:  Patient reports morning stiffness for *** {minute/hour:19697}.   Patient {ACTIONS;DENIES/REPORTS:21021675::"Denies"} nocturnal pain.  Difficulty dressing/grooming: {ACTIONS;DENIES/REPORTS:21021675::"Denies"} Difficulty climbing stairs: {ACTIONS;DENIES/REPORTS:21021675::"Denies"} Difficulty getting out of chair: {ACTIONS;DENIES/REPORTS:21021675::"Denies"} Difficulty using hands for taps, buttons, cutlery, and/or writing: {ACTIONS;DENIES/REPORTS:21021675::"Denies"}   No Rheumatology ROS completed.   PMFS History:  Patient Active Problem List   Diagnosis Date Noted  . 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 Rheumatoid arthritis involving multiple sites with positive rheumatoid factor (HCC) 07/14/2016  . SLE-Sjogren overlap syndrome (HCC) 07/14/2016  . High risk medication use 07/14/2016  . History of anemia 07/14/2016  . History of hypertension 07/14/2016  . History of anxiety 07/14/2016  . History of nephrolithiasis 07/14/2016  . Thalassemia minor 07/14/2016  . History of herpes genitalis 07/14/2016  . Palpitations   . Sinusitis   . Joint pain   . Upper respiratory infection   . Anxiety     Past Medical History:  Diagnosis Date  . Anxiety   . Joint pain   . Palpitations   . Sinusitis   . Upper respiratory infection     Family History  Problem Relation Age of Onset  . Mitral valve prolapse Mother   . Atrial fibrillation Mother   . Cancer Unknown        `  . Hypertension Unknown    No past surgical  history on file. Social History   Social History Narrative  . No narrative on file     Objective: Vital Signs: There were no vitals taken for this visit.   Physical Exam   Musculoskeletal Exam: ***  CDAI Exam: No CDAI exam completed.    Investigation: Findings:   January 2017:  Labs from Dr. February 2017 office showed IgA was normal, tTg was negative.  Iron studies were normal.  Hepatitis panel was negative.  Her ANA was 1:1280 nuclear speckle pattern.  She also had positive Ro antibody.  The rest of the antibodies which include double stranded DNA, RNP, Smith, La, Scl-70, Jo-1 centromere were all negative.      06/25/2015 X-rays of bilateral hands 2 views today were performed which were within normal limits without any joint space narrowing.  X-rays of bilateral feet 2 views also showed bilateral calcaneal spurs.  No MTP changes, no erosive changes were noted.  Bilateral knee joint x-rays 2 views showed right mild medial compartment narrowing, left within the normal joint space.  No chondrocalcinosis, no patellofemoral narrowing.   February 2017; sed rate was 12, rheumatoid factor 95, CCP 16, 14-3-3 antibody was 3.5, which was positive.  Her immunoglobulins, SPEP, TB Gold were negative, TPMT was normal   CBC Latest Ref Rng & Units 01/11/2017 07/08/2016 03/17/2010  WBC 3.8 - 10.8 Thousand/uL 4.5 5.5 11.4(H)  Hemoglobin 11.7 - 15.5 g/dL 10.5(L) 11.0(L) 8.3(L)  Hematocrit 35.0 - 45.0 % 34.1(L) 35.3 25.8(L)  Platelets 140 - 400 Thousand/uL 318 323 182  CMP Latest Ref Rng & Units 01/11/2017 07/08/2016 03/14/2010  Glucose 65 - 99 mg/dL 90 76 96  BUN 7 - 25 mg/dL 12 16 16   Creatinine 0.50 - 1.10 mg/dL 6.59 9.35  Sodium 135 - 146 mmol/L 137 137 135  Potassium 3.5 - 5.3 mmol/L 4.5 4.1 4.5  Chloride 98 - 110 mmol/L 105 106 102  CO2 20 - 32 mmol/L 28 22 23   Calcium 8.6 - 10.2 mg/dL 9.2 9.1 9.8  Total Protein 6.1 - 8.1 g/dL 7.2 7.3 6.2  Total Bilirubin 0.2 - 1.2 mg/dL 0.5 0.4 7.01)    Alkaline Phos 33 - 115 U/L - 73 126(H)  AST 10 - 30 U/L 15 15 29   ALT 6 - 29 U/L 9 9 16      Imaging: No results found.  Speciality Comments: No specialty comments available.    Procedures:  No procedures performed Allergies: Patient has no known allergies.   Assessment / Plan:     Visit Diagnoses: Rheumatoid arthritis involving multiple sites with positive rheumatoid factor (HCC)  Autoimmune disease positive ANA, positive Ro, positive RF positive 14-3-3 eta antibody and positive sicca  High risk medication use - Imuran   History of anemia  History of hypertension  History of anxiety  History of nephrolithiasis  Thalassemia minor  History of herpes genitalis    Orders: No orders of the defined types were placed in this encounter.  No orders of the defined types were placed in this encounter.   Face-to-face time spent with patient was *** minutes. 50% of time was spent in counseling and coordination of care.  Follow-Up Instructions: No Follow-up on file.   Nikia Levels, RT  Note - This record has been created using .  Chart creation errors have been sought, but may not always  have been located. Such creation errors do not reflect on  the standard of medical care.

## 2017-01-27 ENCOUNTER — Ambulatory Visit: Payer: Managed Care, Other (non HMO) | Admitting: Rheumatology

## 2017-03-08 NOTE — Progress Notes (Signed)
Office Visit Note  Patient: Dominique Harvey             Date of Birth: 1971-03-31           MRN: [id number]             PCP: Charlies Silvers, PA-C Referring: Charlies Silvers, [place]* Visit Date: 03/17/2017 Occupation: @GUAROCC @    Subjective:  Medication management   History of Present Illness: Dominique Harvey is a 46 y.o. female with history of autoimmune disease and osteoarthritis. She is doing much better on Imuran. She states she had recent pharyngitis after that she had arthralgias which gradually resolved. She still have sicca symptoms but they are tolerable. She's not experiencing any side effects from the Imuran. Her knee joints are doing better.  Activities of Daily Living:  Patient reports morning stiffness for 5 minutes.   Patient Denies nocturnal pain.  Difficulty dressing/grooming: Denies Difficulty climbing stairs: Reports Difficulty getting out of chair: Denies Difficulty using hands for taps, buttons, cutlery, and/or writing: Denies   Review of Systems  Constitutional: Positive for fatigue. Negative for night sweats, weight gain, weight loss and weakness.  HENT: Positive for mouth dryness. Negative for mouth sores, trouble swallowing, trouble swallowing and nose dryness.   Eyes: Positive for dryness. Negative for pain, redness and visual disturbance.  Respiratory: Negative for cough, shortness of breath and difficulty breathing.   Cardiovascular: Positive for hypertension. Negative for chest pain, palpitations, irregular heartbeat and swelling in legs/feet.  Gastrointestinal: Negative for blood in stool, constipation and diarrhea.  Endocrine: Negative for increased urination.  Genitourinary: Negative for vaginal dryness.  Musculoskeletal: Positive for arthralgias, joint pain and morning stiffness. Negative for joint swelling, myalgias, muscle weakness, muscle tenderness and myalgias.  Skin: Positive for rash. Negative for color change, hair loss, skin  tightness, ulcers and sensitivity to sunlight.       Hives are controlled with zyrtec  Allergic/Immunologic: Negative for susceptible to infections.  Neurological: Negative for dizziness, memory loss and night sweats.  Hematological: Negative for swollen glands.  Psychiatric/Behavioral: Negative for depressed mood and sleep disturbance. The patient is not nervous/anxious.     PMFS History:  Patient Active Problem List   Diagnosis Date Noted  . Primary osteoarthritis of both knees 03/17/2017  . 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 Rheumatoid arthritis involving multiple sites with positive rheumatoid factor (HCC) 07/14/2016  . SLE-Sjogren overlap syndrome (HCC) 07/14/2016  . High risk medication use 07/14/2016  . History of anemia 07/14/2016  . History of hypertension 07/14/2016  . History of anxiety 07/14/2016  . History of nephrolithiasis 07/14/2016  . Thalassemia minor 07/14/2016  . History of herpes genitalis 07/14/2016  . Palpitations   . Sinusitis   . Joint pain   . Upper respiratory infection   . Anxiety     Past Medical History:  Diagnosis Date  . Anxiety   . Joint pain   . Palpitations   . Sinusitis   . Upper respiratory infection     Family History  Problem Relation Age of Onset  . Mitral valve prolapse Mother   . Atrial fibrillation Mother   . Rheum arthritis Mother   . Cancer Unknown        `  . Hypertension Unknown   . Leukemia Father    Past Surgical History:  Procedure Laterality Date  . CESAREAN SECTION  2011  . KIDNEY SURGERY  age 32   . TONSILLECTOMY     age 56   Social History   Social History Narrative  . Not on file     Objective: Vital Signs: BP (!) 161/97 (BP Location: Left Arm, Patient Position: Sitting, Cuff Size: Normal)   Pulse 96   Resp 17   Ht 5\' 8"  (1.727 m)   Wt 183 lb (83 kg)   BMI 27.83 kg/m    Physical Exam  Constitutional: She is  oriented to person, place, and time. She appears well-developed and well-nourished.  HENT:  Head: Normocephalic and atraumatic.  Eyes: Conjunctivae and EOM are normal.  Neck: Normal range of motion.  Cardiovascular: Normal rate, regular rhythm, normal heart sounds and intact distal pulses.  Pulmonary/Chest: Effort normal and breath sounds normal.  Abdominal: Soft. Bowel sounds are normal.  Lymphadenopathy:    She has no cervical adenopathy.  Neurological: She is alert and oriented to person, place, and time.  Skin: Skin is warm and dry. Capillary refill takes less than 2 seconds. There is erythema.  Noted on chest, face and her hands.  Psychiatric: She has a normal mood and affect. Her behavior is normal.  Nursing note and vitals reviewed.    Musculoskeletal Exam: C-spine and thoracic lumbar spine good range of motion. Shoulder joints elbow joints wrist joint MCPs PIPs DIPs with good range of motion with no synovitis. Hip joints knee joints ankles MTPs PIPs DIPs with good range of motion with no synovitis. She had some crepitus with range of motion of her knee joints.  CDAI Exam: No CDAI exam completed.    Investigation: No additional findings. CBC Latest Ref Rng & Units 03/16/2017 01/11/2017 07/08/2016  WBC 3.8 - 10.8 Thousand/uL 5.7 4.5 5.5  Hemoglobin 11.7 - 15.5 g/dL 10.5(L) 10.5(L) 11.0(L)  Hematocrit 35.0 - 45.0 % 34.2(L) 34.1(L) 35.3  Platelets 140 - 400 Thousand/uL 365 318 323   CMP Latest Ref Rng & Units 03/16/2017 01/11/2017 07/08/2016  Glucose 65 - 99 mg/dL 99 90 76  BUN 7 - 25 mg/dL 12 12 16   Creatinine 0.50 - 1.10 mg/dL 09/07/2016 2.42  Sodium 135 - 146 mmol/L 139 137 137  Potassium 3.5 - 5.3 mmol/L 4.3 4.5 4.1  Chloride 98 - 110 mmol/L 106 105 106  CO2 20 - 32 mmol/L 26 28 22   Calcium 8.6 - 10.2 mg/dL 9.1 9.2 9.1  Total Protein 6.1 - 8.1 g/dL 6.7 7.2 7.3  Total Bilirubin 0.2 - 1.2 mg/dL 0.4 0.5 0.4  Alkaline Phos 33 - 115 U/L - - 73  AST 10 - 30 U/L 13 15 15   ALT 6  - 29 U/L 9 9 9     Imaging: No results found.  Speciality Comments: No specialty comments available.    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 She does not have any synovitis on examination. Her sicca symptoms have improved as well. We'll check her labs in February with a standing orders to monitor disease process.Plan: Urinalysis, Routine w reflex microscopic, Serum protein electrophoresis with reflex, Rheumatoid factor, C3 and C4, Sedimentation rate  High risk medication use - Imuran 100 mg by mouth daily. Her labs have been stable. We will continue to monitor them every 3 months for she is on immunosuppressive agent.  Primary osteoarthritis of both knees:  Doing better  Other medical problems are listed as follows:  History of hypertension  History of anxiety  History of nephrolithiasis  History of anemia  Hives - H/O  History of constipation  History of thalassemia minor  History of herpes genitalis    Orders: Orders Placed This Encounter  Procedures  . Urinalysis, Routine w reflex microscopic  . Serum protein electrophoresis with reflex  . Rheumatoid factor  . C3 and C4  . Sedimentation rate   No orders of the defined types were placed in this encounter.    Follow-Up Instructions: Return in about 5 months (around 08/15/2017) for Autoimmune disease.   Pollyann Savoy, MD  Note - This record has been created using Animal nutritionist.  Chart creation errors have been sought, but may not always  have been located. Such creation errors do not reflect on  the standard of medical care.

## 2017-03-16 ENCOUNTER — Other Ambulatory Visit: Payer: Self-pay

## 2017-03-16 DIAGNOSIS — Z79899 Other long term (current) drug therapy: Secondary | ICD-10-CM

## 2017-03-16 LAB — COMPLETE METABOLIC PANEL WITH GFR
AG RATIO: 1.5 (calc) (ref 1.0–2.5)
ALBUMIN MSPROF: 4 g/dL (ref 3.6–5.1)
ALT: 9 U/L (ref 6–29)
AST: 13 U/L (ref 10–30)
Alkaline phosphatase (APISO): 74 U/L (ref 33–115)
BUN: 12 mg/dL (ref 7–25)
CALCIUM: 9.1 mg/dL (ref 8.6–10.2)
CO2: 26 mmol/L (ref 20–32)
Chloride: 106 mmol/L (ref 98–110)
Creat: 0.6 mg/dL (ref 0.50–1.10)
GFR, EST AFRICAN AMERICAN: 128 mL/min/{1.73_m2} (ref >=60)
GFR, EST NON AFRICAN AMERICAN: 111 mL/min/{1.73_m2} (ref >=60)
GLUCOSE: 99 mg/dL (ref 65–99)
Globulin: 2.7 g/dL (calc) (ref 1.9–3.7)
Potassium: 4.3 mmol/L (ref 3.5–5.3)
Sodium: 139 mmol/L (ref 135–146)
Total Bilirubin: 0.4 mg/dL (ref 0.2–1.2)
Total Protein: 6.7 g/dL (ref 6.1–8.1)

## 2017-03-16 LAB — CBC WITH DIFFERENTIAL/PLATELET
BASOS ABS: 11 {cells}/uL (ref 0–200)
BASOS PCT: 0.2 %
EOS ABS: 63 {cells}/uL (ref 15–500)
Eosinophils Relative: 1.1 %
HEMATOCRIT: 34.2 % — AB (ref 35.0–45.0)
HEMOGLOBIN: 10.5 g/dL — AB (ref 11.7–15.5)
Lymphs Abs: 530 cells/uL — ABNORMAL LOW (ref 850–3900)
MCH: 20.3 pg — AB (ref 27.0–33.0)
MCHC: 30.7 g/dL — ABNORMAL LOW (ref 32.0–36.0)
MCV: 66 fL — AB (ref 80.0–100.0)
MPV: 12.4 fL (ref 7.5–12.5)
Monocytes Relative: 9.7 %
NEUTROS ABS: 4543 {cells}/uL (ref 1500–7800)
Neutrophils Relative %: 79.7 %
PLATELETS: 365 10*3/uL (ref 140–400)
RBC: 5.18 10*6/uL — ABNORMAL HIGH (ref 3.80–5.10)
RDW: 16.3 % — ABNORMAL HIGH (ref 11.0–15.0)
Total Lymphocyte: 9.3 %
WBC: 5.7 10*3/uL (ref 3.8–10.8)
WBCMIX: 553 {cells}/uL (ref 200–950)

## 2017-03-17 ENCOUNTER — Encounter: Payer: Self-pay | Admitting: Rheumatology

## 2017-03-17 ENCOUNTER — Other Ambulatory Visit: Payer: Self-pay | Admitting: Physician Assistant

## 2017-03-17 ENCOUNTER — Ambulatory Visit (INDEPENDENT_AMBULATORY_CARE_PROVIDER_SITE_OTHER): Payer: BLUE CROSS/BLUE SHIELD | Admitting: Rheumatology

## 2017-03-17 VITALS — BP 161/97 | HR 96 | Resp 17 | Ht 68.0 in | Wt 183.0 lb

## 2017-03-17 DIAGNOSIS — Z87442 Personal history of urinary calculi: Secondary | ICD-10-CM

## 2017-03-17 DIAGNOSIS — Z862 Personal history of diseases of the blood and blood-forming organs and certain disorders involving the immune mechanism: Secondary | ICD-10-CM

## 2017-03-17 DIAGNOSIS — M170 Bilateral primary osteoarthritis of knee: Secondary | ICD-10-CM | POA: Insufficient documentation

## 2017-03-17 DIAGNOSIS — Z8719 Personal history of other diseases of the digestive system: Secondary | ICD-10-CM

## 2017-03-17 DIAGNOSIS — L509 Urticaria, unspecified: Secondary | ICD-10-CM

## 2017-03-17 DIAGNOSIS — Z8619 Personal history of other infectious and parasitic diseases: Secondary | ICD-10-CM

## 2017-03-17 DIAGNOSIS — Z79899 Other long term (current) drug therapy: Secondary | ICD-10-CM

## 2017-03-17 DIAGNOSIS — Z8679 Personal history of other diseases of the circulatory system: Secondary | ICD-10-CM | POA: Diagnosis not present

## 2017-03-17 DIAGNOSIS — D8989 Other specified disorders involving the immune mechanism, not elsewhere classified: Secondary | ICD-10-CM

## 2017-03-17 DIAGNOSIS — M359 Systemic involvement of connective tissue, unspecified: Secondary | ICD-10-CM

## 2017-03-17 DIAGNOSIS — Z1231 Encounter for screening mammogram for malignant neoplasm of breast: Secondary | ICD-10-CM

## 2017-03-17 DIAGNOSIS — Z8659 Personal history of other mental and behavioral disorders: Secondary | ICD-10-CM

## 2017-03-17 NOTE — Patient Instructions (Signed)
Standing Labs We placed an order today for your standing lab work.    Please come back and get your standing labs in February and every 3 months  We have open lab Monday through Friday from 8:30-11:30 AM and 1:30-4 PM at the office of Dr. Tyrees Chopin.   The office is located at [address], [address], [place],  [address] No appointment is necessary.   Labs are drawn by [facility].  You may receive a bill from [facility] for your lab work. If you have any questions regarding directions or hours of operation,  please call [phone].    

## 2017-04-12 ENCOUNTER — Other Ambulatory Visit: Payer: Self-pay | Admitting: Rheumatology

## 2017-04-13 NOTE — Telephone Encounter (Signed)
Last Visit: 03/17/17 Next Visit: 08/25/17 Labs: 03/16/17 Stable  Okay to refill per Dr. Corliss Skains

## 2017-04-15 ENCOUNTER — Ambulatory Visit
Admission: RE | Admit: 2017-04-15 | Discharge: 2017-04-15 | Disposition: A | Discharge status: Home / Self Care | Payer: Managed Care, Other (non HMO) | Source: Ambulatory Visit | Attending: Physician Assistant | Admitting: Physician Assistant

## 2017-04-15 DIAGNOSIS — Z1231 Encounter for screening mammogram for malignant neoplasm of breast: Secondary | ICD-10-CM

## 2017-07-09 ENCOUNTER — Other Ambulatory Visit: Payer: Self-pay | Admitting: Rheumatology

## 2017-07-11 ENCOUNTER — Other Ambulatory Visit: Payer: Self-pay

## 2017-07-11 DIAGNOSIS — M359 Systemic involvement of connective tissue, unspecified: Secondary | ICD-10-CM

## 2017-07-11 DIAGNOSIS — Z79899 Other long term (current) drug therapy: Secondary | ICD-10-CM

## 2017-07-11 NOTE — Telephone Encounter (Addendum)
Last Visit: 03/17/17 Next Visit: 08/25/17 Labs: 03/16/17 Stable  Left message to advise patient she is due for labs.  Okay to refill 30 day supply per Dr. Corliss Skains

## 2017-07-12 NOTE — Progress Notes (Signed)
ESR mildly elevated . Probably due to anemia. Please forward labs to her PCP. Notify Pt. of anemia.

## 2017-07-13 ENCOUNTER — Other Ambulatory Visit: Payer: Self-pay | Admitting: Rheumatology

## 2017-07-13 LAB — CBC WITH DIFFERENTIAL/PLATELET
BASOS ABS: 20 {cells}/uL (ref 0–200)
Basophils Relative: 0.3 %
EOS ABS: 129 {cells}/uL (ref 15–500)
Eosinophils Relative: 1.9 %
HEMATOCRIT: 33.3 % — AB (ref 35.0–45.0)
HEMOGLOBIN: 10.3 g/dL — AB (ref 11.7–15.5)
LYMPHS ABS: 843 {cells}/uL — AB (ref 850–3900)
MCH: 19.9 pg — ABNORMAL LOW (ref 27.0–33.0)
MCHC: 30.9 g/dL — AB (ref 32.0–36.0)
MCV: 64.3 fL — AB (ref 80.0–100.0)
MPV: 11.9 fL (ref 7.5–12.5)
Monocytes Relative: 11.9 %
NEUTROS PCT: 73.5 %
Neutro Abs: 4998 cells/uL (ref 1500–7800)
Platelets: 408 10*3/uL — ABNORMAL HIGH (ref 140–400)
RBC: 5.18 10*6/uL — ABNORMAL HIGH (ref 3.80–5.10)
RDW: 16 % — AB (ref 11.0–15.0)
Total Lymphocyte: 12.4 %
WBC: 6.8 10*3/uL (ref 3.8–10.8)
WBCMIX: 809 {cells}/uL (ref 200–950)

## 2017-07-13 LAB — COMPLETE METABOLIC PANEL WITH GFR
AG RATIO: 1.4 (calc) (ref 1.0–2.5)
ALT: 9 U/L (ref 6–29)
AST: 13 U/L (ref 10–30)
Albumin: 4.3 g/dL (ref 3.6–5.1)
Alkaline phosphatase (APISO): 77 U/L (ref 33–115)
BILIRUBIN TOTAL: 0.3 mg/dL (ref 0.2–1.2)
BUN: 12 mg/dL (ref 7–25)
CHLORIDE: 104 mmol/L (ref 98–110)
CO2: 28 mmol/L (ref 20–32)
Calcium: 9.8 mg/dL (ref 8.6–10.2)
Creat: 0.71 mg/dL (ref 0.50–1.10)
GFR, EST AFRICAN AMERICAN: 120 mL/min/{1.73_m2} (ref >=60)
GFR, EST NON AFRICAN AMERICAN: 104 mL/min/{1.73_m2} (ref >=60)
GLOBULIN: 3 g/dL (ref 1.9–3.7)
Glucose, Bld: 76 mg/dL (ref 65–99)
POTASSIUM: 4.5 mmol/L (ref 3.5–5.3)
SODIUM: 138 mmol/L (ref 135–146)
TOTAL PROTEIN: 7.3 g/dL (ref 6.1–8.1)

## 2017-07-13 LAB — URINALYSIS, ROUTINE W REFLEX MICROSCOPIC
BILIRUBIN URINE: NEGATIVE
GLUCOSE, UA: NEGATIVE
Hgb urine dipstick: NEGATIVE
Ketones, ur: NEGATIVE
Leukocytes, UA: NEGATIVE
Nitrite: NEGATIVE
PH: 6.5 (ref 5.0–8.0)
PROTEIN: NEGATIVE
Specific Gravity, Urine: 1.006 (ref 1.001–1.03)

## 2017-07-13 LAB — PROTEIN ELECTROPHORESIS, SERUM, WITH REFLEX
ALPHA 1: 0.4 g/dL — AB (ref 0.2–0.3)
Albumin ELP: 4.1 g/dL (ref 3.8–4.8)
Alpha 2: 0.7 g/dL (ref 0.5–0.9)
BETA 2: 0.4 g/dL (ref 0.2–0.5)
Beta Globulin: 0.5 g/dL (ref 0.4–0.6)
GAMMA GLOBULIN: 1.2 g/dL (ref 0.8–1.7)
Total Protein: 7.3 g/dL (ref 6.1–8.1)

## 2017-07-13 LAB — CBC MORPHOLOGY

## 2017-07-13 LAB — C3 AND C4
C3 Complement: 158 mg/dL (ref 83–193)
C4 Complement: 34 mg/dL (ref 15–57)

## 2017-07-13 LAB — SEDIMENTATION RATE: Sed Rate: 25 mm/h — ABNORMAL HIGH (ref 0–20)

## 2017-07-13 LAB — RHEUMATOID FACTOR: Rhuematoid fact SerPl-aCnc: 42 IU/mL — ABNORMAL HIGH (ref <14)

## 2017-07-13 NOTE — Telephone Encounter (Signed)
Last Visit: 03/17/17 Next Visit: 08/25/17 Labs: 07/11/17 ESR mildly elevated . Probably due to anemia  Okay to refill per Dr. Estanislado Pandy

## 2017-07-18 ENCOUNTER — Telehealth: Payer: Self-pay | Admitting: *Deleted

## 2017-07-18 DIAGNOSIS — R899 Unspecified abnormal finding in specimens from other organs, systems and tissues: Secondary | ICD-10-CM

## 2017-07-18 NOTE — Telephone Encounter (Signed)
-----   Message from Pollyann Savoy, MD sent at 07/18/2017  1:33 PM EDT ----- I called patient and discussed the lab results.  It is possible that the abnormal RBC morphology could be related to her thalassemia.  Please make a referral to hematology.

## 2017-07-18 NOTE — Progress Notes (Signed)
I called patient and discussed the lab results.  It is possible that the abnormal RBC morphology could be related to her thalassemia.  Please make a referral to hematology.

## 2017-08-12 ENCOUNTER — Other Ambulatory Visit: Payer: Self-pay | Admitting: Family

## 2017-08-12 DIAGNOSIS — Z862 Personal history of diseases of the blood and blood-forming organs and certain disorders involving the immune mechanism: Secondary | ICD-10-CM

## 2017-08-12 DIAGNOSIS — R778 Other specified abnormalities of plasma proteins: Secondary | ICD-10-CM

## 2017-08-12 NOTE — Progress Notes (Signed)
Office Visit Note  Patient: Dominique Harvey             Date of Birth: 1970-10-07           MRN: [id number]             PCP: Charlies Silvers, PA-C Referring: Charlies Silvers, [place]* Visit Date: 08/25/2017 Occupation: @GUAROCC @    Subjective:  Medication management.   History of Present Illness: Dominique Harvey is a 47 y.o. female story of autoimmune disease and osteoarthritis.  She states she was seen by Dr. 54 for evaluation of her RBC count.  She was suggested to start on folic acid.  She has been exercising for the last few weeks now.  She is been doing strength training and aerobics.  She has been noticing some discomfort in her knee joints.  She denies any joint swelling.  She denies any sicca symptoms currently.  She has been drinking water frequently.  Activities of Daily Living:  Patient reports morning stiffness for 2 minutes.   Patient Denies nocturnal pain.  Difficulty dressing/grooming: Denies Difficulty climbing stairs: Reports Difficulty getting out of chair: Denies Difficulty using hands for taps, buttons, cutlery, and/or writing: Denies   Review of Systems  Constitutional: Positive for fatigue. Negative for night sweats, weight gain and weight loss.  HENT: Positive for mouth dryness. Negative for mouth sores, trouble swallowing, trouble swallowing and nose dryness.   Eyes: Positive for dryness. Negative for pain, redness and visual disturbance.  Respiratory: Negative for cough, shortness of breath and difficulty breathing.   Cardiovascular: Positive for hypertension. Negative for chest pain, palpitations, irregular heartbeat and swelling in legs/feet.  Gastrointestinal: Negative for blood in stool, constipation and diarrhea.  Endocrine: Negative for increased urination.  Genitourinary: Negative for vaginal dryness.  Musculoskeletal: Positive for arthralgias, joint pain and morning stiffness. Negative for joint swelling, myalgias, muscle weakness,  muscle tenderness and myalgias.  Skin: Negative for color change, rash, hair loss, skin tightness, ulcers and sensitivity to sunlight.  Allergic/Immunologic: Negative for susceptible to infections.  Neurological: Negative for dizziness, memory loss, night sweats and weakness.  Hematological: Negative for swollen glands.  Psychiatric/Behavioral: Negative for depressed mood and sleep disturbance. The patient is not nervous/anxious.     PMFS History:  Patient Active Problem List   Diagnosis Date Noted  . Primary osteoarthritis of both knees 03/17/2017  . 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 Rheumatoid arthritis involving multiple sites with positive rheumatoid factor (HCC) 07/14/2016  . SLE-Sjogren overlap syndrome (HCC) 07/14/2016  . High risk medication use 07/14/2016  . History of anemia 07/14/2016  . History of hypertension 07/14/2016  . History of anxiety 07/14/2016  . History of nephrolithiasis 07/14/2016  . Thalassemia minor 07/14/2016  . History of herpes genitalis 07/14/2016  . Palpitations   . Sinusitis   . Joint pain   . Upper respiratory infection   . Anxiety     Past Medical History:  Diagnosis Date  . Anxiety   . Joint pain   . Palpitations   . Sinusitis   . Upper respiratory infection     Family History  Problem Relation Age of Onset  . Mitral valve prolapse Mother   . Atrial fibrillation Mother   . Rheum arthritis Mother   . Cancer Unknown        `  . Hypertension Unknown   . Leukemia Father   . Healthy Daughter   .  Breast cancer Neg Hx    Past Surgical History:  Procedure Laterality Date  . CESAREAN SECTION  2011  . KIDNEY SURGERY     age 71   . TONSILLECTOMY     age 42   Social History   Social History Narrative  . Not on file     Objective: Vital Signs: BP 124/90 (BP Location: Left Arm, Patient Position: Sitting, Cuff Size: Normal)   Pulse 88   Resp 14    Ht 5\' 8"  (1.727 m)   Wt 176 lb 8 oz (80.1 kg)   BMI 26.84 kg/m    Physical Exam  Constitutional: She is oriented to person, place, and time. She appears well-developed and well-nourished.  HENT:  Head: Normocephalic and atraumatic.  Eyes: Conjunctivae and EOM are normal.  Neck: Normal range of motion.  Cardiovascular: Normal rate, regular rhythm, normal heart sounds and intact distal pulses.  Pulmonary/Chest: Effort normal and breath sounds normal.  Abdominal: Soft. Bowel sounds are normal.  Lymphadenopathy:    She has no cervical adenopathy.  Neurological: She is alert and oriented to person, place, and time.  Skin: Skin is warm and dry. Capillary refill takes less than 2 seconds.  Mild facial erythema was noted.  Also some erythema noted on her neck and upper arms.  Psychiatric: She has a normal mood and affect. Her behavior is normal.  Nursing note and vitals reviewed.    Musculoskeletal Exam: C-spine thoracic lumbar spine good range of motion.  Shoulder joints elbow joints wrist joint MCPs PIPs DIPs were in good range of motion with no synovitis.  Hip joints knee joints ankles MTPs PIPs DIPs were in good range of motion with no synovitis.  She is some crepitus in her knee joints.  CDAI Exam: No CDAI exam completed.    Investigation: No additional findings. CBC Latest Ref Rng & Units 08/15/2017 07/11/2017 03/16/2017  WBC 3.9 - 10.0 K/uL 4.7 6.8 5.7  Hemoglobin 11.6 - 15.9 g/dL 10.7(L) 10.3(L) 10.5(L)  Hematocrit 34.8 - 46.6 % 33.0(L) 33.3(L) 34.2(L)  Platelets 145 - 400 K/uL 331 408(H) 365   CMP Latest Ref Rng & Units 08/15/2017 07/11/2017 07/11/2017  Glucose 70 - 140 mg/dL 82 76 -  BUN 7 - 26 mg/dL 13 12 -  Creatinine 9.98 - 1.10 mg/dL 3.38 2.50 -  Sodium 539 - 145 mmol/L 139 138 -  Potassium 3.5 - 5.1 mmol/L 3.9 4.5 -  Chloride 98 - 109 mmol/L 107 104 -  CO2 22 - 29 mmol/L 25 28 -  Calcium 8.4 - 10.4 mg/dL 9.7 9.8 -  Total Protein 6.4 - 8.3 g/dL 7.7 7.3 7.3  Total  Bilirubin 0.2 - 1.2 mg/dL 0.5 0.3 -  Alkaline Phos 40 - 150 U/L 69 - -  AST 5 - 34 U/L 17 13 -  ALT 0 - 55 U/L 15 9 -    Imaging: No results found.  Speciality Comments: No specialty comments available.    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  She has mild erythema and malar rash.  Use of sunscreen on a regular basis was discussed.  Her sicca symptoms are tolerable.  High risk medication use - Imuran 100 mg by mouth daily.  Her labs are stable except for anemia.  She will  continue to get labs every 3 months.  Primary osteoarthritis of both knees-she has been having some increased discomfort in her knee joints that she is been exercising on a regular basis.  Muscle strengthening exercises were discussed.  History of hypertension-her diastolic blood pressure is slightly elevated.  History of anxiety  History of thalassemia minor-she had recent evaluation by Dr. Myna Hidalgo.  She was advised to take folic acid for anemia.  History of nephrolithiasis  History of anemia    Orders: No orders of the defined types were placed in this encounter.  No orders of the defined types were placed in this encounter.   Face-to-face time spent with patient was 30 minutes.  Greater than 50% of time was spent in counseling and coordination of care.  Follow-Up Instructions: Return in about 5 months (around 01/25/2018) for Autoimmune disease, Osteoarthritis.   Pollyann Savoy, MD  Note - This record has been created using Animal nutritionist.  Chart creation errors have been sought, but may not always  have been located. Such creation errors do not reflect on  the standard of medical care.

## 2017-08-15 ENCOUNTER — Other Ambulatory Visit: Payer: Self-pay

## 2017-08-15 ENCOUNTER — Inpatient Hospital Stay: Payer: BLUE CROSS/BLUE SHIELD

## 2017-08-15 ENCOUNTER — Inpatient Hospital Stay: Payer: BLUE CROSS/BLUE SHIELD | Attending: Family | Admitting: Family

## 2017-08-15 ENCOUNTER — Encounter: Payer: Self-pay | Admitting: Family

## 2017-08-15 VITALS — BP 140/81 | HR 89 | Temp 98.6°F | Resp 18 | Wt 177.0 lb

## 2017-08-15 DIAGNOSIS — D563 Thalassemia minor: Secondary | ICD-10-CM | POA: Insufficient documentation

## 2017-08-15 DIAGNOSIS — M069 Rheumatoid arthritis, unspecified: Secondary | ICD-10-CM | POA: Insufficient documentation

## 2017-08-15 DIAGNOSIS — D509 Iron deficiency anemia, unspecified: Secondary | ICD-10-CM | POA: Diagnosis not present

## 2017-08-15 DIAGNOSIS — D561 Beta thalassemia: Secondary | ICD-10-CM

## 2017-08-15 DIAGNOSIS — Z862 Personal history of diseases of the blood and blood-forming organs and certain disorders involving the immune mechanism: Secondary | ICD-10-CM

## 2017-08-15 DIAGNOSIS — N920 Excessive and frequent menstruation with regular cycle: Secondary | ICD-10-CM | POA: Insufficient documentation

## 2017-08-15 DIAGNOSIS — Z79899 Other long term (current) drug therapy: Secondary | ICD-10-CM | POA: Insufficient documentation

## 2017-08-15 DIAGNOSIS — R778 Other specified abnormalities of plasma proteins: Secondary | ICD-10-CM

## 2017-08-15 LAB — CMP (CANCER CENTER ONLY)
ALBUMIN: 4 g/dL (ref 3.5–5.0)
ALT: 15 U/L (ref 0–55)
AST: 17 U/L (ref 5–34)
Alkaline Phosphatase: 69 U/L (ref 40–150)
Anion gap: 7 (ref 3–11)
BUN: 13 mg/dL (ref 7–26)
CO2: 25 mmol/L (ref 22–29)
CREATININE: 0.75 mg/dL (ref 0.60–1.10)
Calcium: 9.7 mg/dL (ref 8.4–10.4)
Chloride: 107 mmol/L (ref 98–109)
GFR, Est AFR Am: >60 mL/min (ref >60)
GFR, Estimated: >60 mL/min (ref >60)
GLUCOSE: 82 mg/dL (ref 70–140)
POTASSIUM: 3.9 mmol/L (ref 3.5–5.1)
Sodium: 139 mmol/L (ref 136–145)
Total Bilirubin: 0.5 mg/dL (ref 0.2–1.2)
Total Protein: 7.7 g/dL (ref 6.4–8.3)

## 2017-08-15 LAB — RETICULOCYTES
RBC.: 5.14 MIL/uL (ref 3.70–5.45)
RETIC CT PCT: 1.4 % (ref 0.7–2.1)
Retic Count, Absolute: 72 10*3/uL (ref 33.7–90.7)

## 2017-08-15 LAB — CBC WITH DIFFERENTIAL (CANCER CENTER ONLY)
BASOS ABS: 0 10*3/uL (ref 0.0–0.1)
Basophils Relative: 0 %
Eosinophils Absolute: 0.1 10*3/uL (ref 0.0–0.5)
Eosinophils Relative: 2 %
HCT: 33 % — ABNORMAL LOW (ref 34.8–46.6)
HEMOGLOBIN: 10.7 g/dL — AB (ref 11.6–15.9)
LYMPHS ABS: 0.4 10*3/uL — AB (ref 0.9–3.3)
LYMPHS PCT: 9 %
MCH: 20.8 pg — ABNORMAL LOW (ref 26.0–34.0)
MCHC: 32.4 g/dL (ref 32.0–36.0)
MCV: 64.1 fL — AB (ref 81.0–101.0)
MONO ABS: 0.4 10*3/uL (ref 0.1–0.9)
MONOS PCT: 9 %
NEUTROS ABS: 3.8 10*3/uL (ref 1.5–6.5)
Neutrophils Relative %: 80 %
Platelet Count: 331 10*3/uL (ref 145–400)
RBC: 5.15 MIL/uL (ref 3.70–5.32)
RDW: 15.8 % — ABNORMAL HIGH (ref 11.1–15.7)
WBC Count: 4.7 10*3/uL (ref 3.9–10.0)

## 2017-08-15 LAB — IRON AND TIBC
Iron: 124 ug/dL (ref 41–142)
SATURATION RATIOS: 36 % (ref 21–57)
TIBC: 345 ug/dL (ref 236–444)
UIBC: 221 ug/dL

## 2017-08-15 LAB — FERRITIN: Ferritin: 57 ng/mL (ref 9–269)

## 2017-08-15 LAB — SAVE SMEAR

## 2017-08-15 LAB — VITAMIN B12: VITAMIN B 12: 303 pg/mL (ref 180–914)

## 2017-08-15 LAB — LACTATE DEHYDROGENASE: LDH: 151 U/L (ref 125–245)

## 2017-08-15 MED ORDER — FOLIC ACID 1 MG PO TABS: 1.0000 mg | ORAL_TABLET | Freq: Every day | ORAL | 11 refills | Status: DC

## 2017-08-15 NOTE — Progress Notes (Signed)
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 She hs tried taking oral iron in the past with no improvement. She has never taken folic acid.  She had an Alpha 1 of 0.4, no M-spike mentioned. Protein studies for today are pending.  Hgb is 10.7 with an MCV of 64.  She is symptomatic with fatigue and palpitations. She states that her work-up with cardiology was negative.  She has a heavy, regular cycles. No other episodes of bleeding, no bruising or petechiae.  She has hot flashes starting several days before each cycle.  She has a 31 yo daughter and no history of miscarriage.  She states that her mother and 2 siblings also have beta thalassemia minor.  She has rheumatoid arthritis and is on Imuran 100 mg PO daily.  No personal cancer history. Familial history includes: Father - CLL, maternal grandfather and grandmother - lung (not smokers) and maternal aunt - metastatic pancreatic cancer.  She has had no issue with frequent infection. No fever, chills, n/v, cough, rash, dizziness, SOB, chest pain, palpitations, abdominal pain or changes in bowel or bladder habits.  No swelling, tenderness, numbness or tingling in her extremities. No c/o pain.   She has maintained a good appetite and is staying well hydrated. Her weight is stable.  She work out at [employer] 3-4 days a week.  She has worked for a [employer] in [place] for the last 20 years.    ROS: All other 10 point review of systems is negative.   PAST MEDICAL HISTORY:   Past Medical History:  Diagnosis Date  .  Anxiety   . Joint pain   . Palpitations   . Sinusitis   . Upper respiratory infection     ALLERGIES: Allergies  Allergen Reactions  . Sulfa Antibiotics Nausea And Vomiting and Rash    unknown       MEDICATIONS:  Current Outpatient Medications on File Prior to Visit  Medication Sig Dispense Refill  . acyclovir (ZOVIRAX) 400 MG tablet Take 400 mg 2 (two) times daily by mouth.    . ALPRAZolam (XANAX) 0.5 MG tablet Take 0.25 mg by mouth as needed.     Marland Kitchen azaTHIOprine (IMURAN) 50 MG tablet TAKE 2 TABLETS BY MOUTH AT BEDTIME 60 tablet 0  . azaTHIOprine (IMURAN) 50 MG tablet TAKE 2 TABLETS BY MOUTH AT BEDTIME 180 tablet 0  . cetirizine (ZYRTEC) 10 MG tablet Take 10 mg by mouth daily.    . Cholecalciferol (VITAMIN D-1000 MAX ST) 1000 units tablet Take by mouth.    . L-Lysine 500 MG TABS Take by mouth.    . Multiple Vitamin (MULTIVITAMIN) capsule Take 1 capsule by mouth daily.    . Omega-3 Fatty Acids (FISH OIL) 500 MG CAPS Take 2 (two) times daily by mouth.    . propranolol (INDERAL) 10 MG tablet Take 1 tablet (10 mg total) by mouth daily as needed. 30 tablet 3  . valACYclovir (VALTREX) 1000 MG tablet Take 1,000 mg by mouth as directed.    . venlafaxine (EFFEXOR) 75 MG tablet 3  tabs po qd    . vitamin C (ASCORBIC ACID) 500 MG tablet Take 500 mg by mouth.     No current facility-administered medications on file prior to visit.      PAST SURGICAL HISTORY Past Surgical History:  Procedure Laterality Date  . CESAREAN SECTION  2011  . KIDNEY SURGERY     age 52   . TONSILLECTOMY     age 61    FAMILY HISTORY: Family History  Problem Relation Age of Onset  . Mitral valve prolapse Mother   . Atrial fibrillation Mother   . Rheum arthritis Mother   . Cancer Unknown        `  . Hypertension Unknown   . Leukemia Father   . Breast cancer Neg Hx     SOCIAL HISTORY:  reports that she quit smoking about 25 years ago. Her smoking use included cigarettes. She has a 5.00 pack-year  smoking history. She has never used smokeless tobacco. She reports that she drinks alcohol. She reports that she does not use drugs.  PERFORMANCE STATUS: The patient's performance status is 1 - Symptomatic but completely ambulatory  PHYSICAL EXAM: Most Recent Vital Signs: There were no vitals taken for this visit. BP 140/81 (BP Location: Left Arm, Patient Position: Sitting)   Pulse 89   Temp 98.6 F (37 C) (Oral)   Resp 18   Wt 177 lb (80.3 kg)   SpO2 100%   BMI 26.91 kg/m   General Appearance:    Alert, cooperative, no distress, appears stated age  Head:    Normocephalic, without obvious abnormality, atraumatic  Eyes:    PERRL, conjunctiva/corneas clear, EOM's intact, fundi    benign, both eyes  Ears:    Normal TM's and external ear canals, both ears  Nose:   Nares normal, septum midline, mucosa normal, no drainage    or sinus tenderness  Throat:   Lips, mucosa, and tongue normal; teeth and gums normal  Neck:   Supple, symmetrical, trachea midline, no adenopathy;    thyroid:  no enlargement/tenderness/nodules; no carotid   bruit or JVD  Back:     Symmetric, no curvature, ROM normal, no CVA tenderness  Lungs:     Clear to auscultation bilaterally, respirations unlabored  Chest Wall:    No tenderness or deformity   Heart:    Regular rate and rhythm, S1 and S2 normal, no murmur, rub   or gallop  Breast Exam:    No tenderness, masses, or nipple abnormality  Abdomen:     Soft, non-tender, bowel sounds active all four quadrants,    no masses, no organomegaly  Genitalia:    Normal female without lesion, discharge or tenderness  Rectal:    Normal tone, normal prostate, no masses or tenderness;   guaiac negative stool  Extremities:   Extremities normal, atraumatic, no cyanosis or edema  Pulses:   2+ and symmetric all extremities  Skin:   Skin color, texture, turgor normal, no rashes or lesions  Lymph nodes:   Cervical, supraclavicular, and axillary nodes normal  Neurologic:    CNII-XII intact, normal strength, sensation and reflexes    throughout    LABORATORY DATA:  Results for orders placed or performed in visit on 08/15/17 (from the past 48 hour(s))  CBC with Differential ([facility] Only)     Status: Abnormal (Preliminary result)   Collection Time: 08/15/17  8:35 AM  Result Value Ref Range   WBC Count 4.7 3.9 - 10.0 K/uL  RBC 5.15 3.70 - 5.32 MIL/uL   Hemoglobin 10.7 (L) 11.6 - 15.9 g/dL   HCT 33.0 (L) 34.8 - 46.6 %   MCV 64.1 (L) 81.0 - 101.0 fL   MCH 20.8 (L) 26.0 - 34.0 pg   MCHC 32.4 32.0 - 36.0 g/dL   RDW 15.8 (H) 11.1 - 15.7 %   Platelet Count PENDING 145 - 400 K/uL   Neutrophils Relative % 80 %   Neutro Abs 3.8 1.5 - 6.5 K/uL   Lymphocytes Relative 9 %   Lymphs Abs 0.4 (L) 0.9 - 3.3 K/uL   Monocytes Relative 9 %   Monocytes Absolute 0.4 0.1 - 0.9 K/uL   Eosinophils Relative 2 %   Eosinophils Absolute 0.1 0.0 - 0.5 K/uL   Basophils Relative 0 %   Basophils Absolute 0.0 0.0 - 0.1 K/uL    Comment: Performed at [facility] at [facility], [address] [place], [place] [address]      RADIOGRAPHY: No results found.     PATHOLOGY: None  ASSESSMENT/PLAN: Ms. 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 She is symptomatic with fatigue and palpitations.  We will go ahead and have her start taking folic acid 1 mg PO daily.  We will see what her iron studies show and bring her back in for infusion if needed. We talked at length about what this entails and the possible cost. She will be put in contact with Baxter Flattery, our financial counselor, if infusion is needed.  Once we have all her lab results we will determine when to schedule her follow-up.      All questions were answered and she is in agreement with the plan. She will contact our office with any questions or concerns. We can certainly see her sooner if need  be.  She was discussed with and also seen by Dr. Marin Olp and he is in agreement with the aforementioned.   Laverna Peace    ADDENDUM: I saw and examined the patient with Judson Roch.  I agree with the above assessment.  Her iron studies certain look adequate.  Her ferritin is 57 with iron saturation of 36%.  I looked at her blood under the microscope.  She does have hypochromic and microcytic red blood cells.  I am sure that this is from the thalassemia.  We will see what her hemoglobin electrophoresis is.  I do not think it would be a bad idea to get her on folic acid.  I do not see anything that suggest a hematologic malignancy.  I do not see anything that looks like hemolytic anemia.  Her to count is a little bit on the lower side.  I do not see that she would need a bone marrow biopsy.  For right now, we will see what her hemoglobin electrophoresis looks like.  We spent about 45 minutes with her.  Over half the time was spent face-to-face.  We answered all of her questions.  I am not sure if she had to come back to see [place].  Again, there are just is not any obvious hematologic problem that we will have to deal with unless something comes up with her additional lab work.  Lattie Haw, MD

## 2017-08-16 LAB — IGG, IGA, IGM
IGG (IMMUNOGLOBIN G), SERUM: 1468 mg/dL (ref 700–1600)
IgA: 161 mg/dL (ref 87–352)
IgM (Immunoglobulin M), Srm: 42 mg/dL (ref 26–217)

## 2017-08-16 LAB — KAPPA/LAMBDA LIGHT CHAINS
Kappa free light chain: 26 mg/L — ABNORMAL HIGH (ref 3.3–19.4)
Kappa, lambda light chain ratio: 1.69 — ABNORMAL HIGH (ref 0.26–1.65)
Lambda free light chains: 15.4 mg/L (ref 5.7–26.3)

## 2017-08-16 LAB — ERYTHROPOIETIN: Erythropoietin: 13.2 m[IU]/mL (ref 2.6–18.5)

## 2017-08-17 LAB — PROTEIN ELECTROPHORESIS, SERUM
A/G RATIO SPE: 1 (ref 0.7–1.7)
ALBUMIN ELP: 3.7 g/dL (ref 2.9–4.4)
Alpha-1-Globulin: 0.2 g/dL (ref 0.0–0.4)
Alpha-2-Globulin: 0.6 g/dL (ref 0.4–1.0)
Beta Globulin: 1.1 g/dL (ref 0.7–1.3)
Gamma Globulin: 1.6 g/dL (ref 0.4–1.8)
Globulin, Total: 3.6 g/dL (ref 2.2–3.9)
TOTAL PROTEIN ELP: 7.3 g/dL (ref 6.0–8.5)

## 2017-08-17 LAB — HEMOGLOBINOPATHY EVALUATION
HGB A: 94.2 % — AB (ref 96.4–98.8)
HGB VARIANT: 0 %
Hgb A2 Quant: 5.8 % — ABNORMAL HIGH (ref 1.8–3.2)
Hgb C: 0 %
Hgb F Quant: 0 % (ref 0.0–2.0)
Hgb S Quant: 0 %

## 2017-08-22 ENCOUNTER — Telehealth: Payer: Self-pay | Admitting: Family

## 2017-08-22 NOTE — Telephone Encounter (Signed)
I spoke with Dominique Harvey and went over all her lab work with her. All questions were answered and she has started her folic acid. We will plan to see her back in another 6 months.

## 2017-08-25 ENCOUNTER — Ambulatory Visit (INDEPENDENT_AMBULATORY_CARE_PROVIDER_SITE_OTHER): Payer: BLUE CROSS/BLUE SHIELD | Admitting: Rheumatology

## 2017-08-25 ENCOUNTER — Encounter: Payer: Self-pay | Admitting: Rheumatology

## 2017-08-25 VITALS — BP 124/90 | HR 88 | Resp 14 | Ht 68.0 in | Wt 176.5 lb

## 2017-08-25 DIAGNOSIS — Z87442 Personal history of urinary calculi: Secondary | ICD-10-CM | POA: Diagnosis not present

## 2017-08-25 DIAGNOSIS — D8989 Other specified disorders involving the immune mechanism, not elsewhere classified: Secondary | ICD-10-CM

## 2017-08-25 DIAGNOSIS — Z862 Personal history of diseases of the blood and blood-forming organs and certain disorders involving the immune mechanism: Secondary | ICD-10-CM | POA: Diagnosis not present

## 2017-08-25 DIAGNOSIS — M170 Bilateral primary osteoarthritis of knee: Secondary | ICD-10-CM

## 2017-08-25 DIAGNOSIS — Z79899 Other long term (current) drug therapy: Secondary | ICD-10-CM

## 2017-08-25 DIAGNOSIS — Z8659 Personal history of other mental and behavioral disorders: Secondary | ICD-10-CM | POA: Diagnosis not present

## 2017-08-25 DIAGNOSIS — L509 Urticaria, unspecified: Secondary | ICD-10-CM | POA: Diagnosis not present

## 2017-08-25 DIAGNOSIS — Z8679 Personal history of other diseases of the circulatory system: Secondary | ICD-10-CM

## 2017-08-25 DIAGNOSIS — M359 Systemic involvement of connective tissue, unspecified: Secondary | ICD-10-CM

## 2017-08-25 NOTE — Patient Instructions (Signed)
Standing Labs We placed an order today for your standing lab work.    Please come back and get your standing labs in June and every 3 months have open lab Monday through Friday from 8:30-11:30 AM and 1:30-4:00 PM  at the office of Dr. Pollyann Savoy.   You may experience shorter wait times on Monday and Friday afternoons. The office is located at [address], [place], [place] [address] No appointment is necessary.   Labs are drawn by [facility].  You may receive a bill from [facility] for your lab work. If you have any questions regarding directions or hours of operation,  please call [phone].     Knee Exercises Ask your health care provider which exercises are safe for you. Do exercises exactly as told by your health care provider and adjust them as directed. It is normal to feel mild stretching, pulling, tightness, or discomfort as you do these exercises, but you should stop right away if you feel sudden pain or your pain gets worse.Do not begin these exercises until told by your health care provider. STRETCHING AND RANGE OF MOTION EXERCISES These exercises warm up your muscles and joints and improve the movement and flexibility of your knee. These exercises also help to relieve pain, numbness, and tingling. Exercise A: Knee Extension, Prone 1. Lie on your abdomen on a bed. 2. Place your left / right knee just beyond the edge of the surface so your knee is not on the bed. You can put a towel under your left / right thigh just above your knee for comfort. 3. Relax your leg muscles and allow gravity to straighten your knee. You should feel a stretch behind your left / right knee. 4. Hold this position for __________ seconds. 5. Scoot up so your knee is supported between repetitions. Repeat __________ times. Complete this stretch __________ times a day. Exercise B: Knee Flexion, Active  1. Lie on your back with both knees straight. If this causes back discomfort, bend your  left / right knee so your foot is flat on the floor. 2. Slowly slide your left / right heel back toward your buttocks until you feel a gentle stretch in the front of your knee or thigh. 3. Hold this position for __________ seconds. 4. Slowly slide your left / right heel back to the starting position. Repeat __________ times. Complete this exercise __________ times a day. Exercise C: Quadriceps, Prone  1. Lie on your abdomen on a firm surface, such as a bed or padded floor. 2. Bend your left / right knee and hold your ankle. If you cannot reach your ankle or pant leg, loop a belt around your foot and grab the belt instead. 3. Gently pull your heel toward your buttocks. Your knee should not slide out to the side. You should feel a stretch in the front of your thigh and knee. 4. Hold this position for __________ seconds. Repeat __________ times. Complete this stretch __________ times a day. Exercise D: Hamstring, Supine 1. Lie on your back. 2. Loop a belt or towel over the ball of your left / right foot. The ball of your foot is on the walking surface, right under your toes. 3. Straighten your left / right knee and slowly pull on the belt to raise your leg until you feel a gentle stretch behind your knee. ? Do not let your left / right knee bend while you do this. ? Keep your other leg flat on the floor.  4. Hold this position for __________ seconds. Repeat __________ times. Complete this stretch __________ times a day. STRENGTHENING EXERCISES These exercises build strength and endurance in your knee. Endurance is the ability to use your muscles for a long time, even after they get tired. Exercise E: Quadriceps, Isometric  1. Lie on your back with your left / right leg extended and your other knee bent. Put a rolled towel or small pillow under your knee if told by your health care provider. 2. Slowly tense the muscles in the front of your left / right thigh. You should see your kneecap slide up  toward your hip or see increased dimpling just above the knee. This motion will push the back of the knee toward the floor. 3. For __________ seconds, keep the muscle as tight as you can without increasing your pain. 4. Relax the muscles slowly and completely. Repeat __________ times. Complete this exercise __________ times a day. Exercise F: Straight Leg Raises - Quadriceps 1. Lie on your back with your left / right leg extended and your other knee bent. 2. Tense the muscles in the front of your left / right thigh. You should see your kneecap slide up or see increased dimpling just above the knee. Your thigh may even shake a bit. 3. Keep these muscles tight as you raise your leg 4-6 inches (10-15 cm) off the floor. Do not let your knee bend. 4. Hold this position for __________ seconds. 5. Keep these muscles tense as you lower your leg. 6. Relax your muscles slowly and completely after each repetition. Repeat __________ times. Complete this exercise __________ times a day. Exercise G: Hamstring, Isometric 1. Lie on your back on a firm surface. 2. Bend your left / right knee approximately __________ degrees. 3. Dig your left / right heel into the surface as if you are trying to pull it toward your buttocks. Tighten the muscles in the back of your thighs to dig as hard as you can without increasing any pain. 4. Hold this position for __________ seconds. 5. Release the tension gradually and allow your muscles to relax completely for __________ seconds after each repetition. Repeat __________ times. Complete this exercise __________ times a day. Exercise H: Hamstring Curls  If told by your health care provider, do this exercise while wearing ankle weights. Begin with __________ weights. Then increase the weight by 1 lb (0.5 kg) increments. Do not wear ankle weights that are more than __________. 1. Lie on your abdomen with your legs straight. 2. Bend your left / right knee as far as you can  without feeling pain. Keep your hips flat against the floor. 3. Hold this position for __________ seconds. 4. Slowly lower your leg to the starting position.  Repeat __________ times. Complete this exercise __________ times a day. Exercise I: Squats (Quadriceps) 1. Stand in front of a table, with your feet and knees pointing straight ahead. You may rest your hands on the table for balance but not for support. 2. Slowly bend your knees and lower your hips like you are going to sit in a chair. ? Keep your weight over your heels, not over your toes. ? Keep your lower legs upright so they are parallel with the table legs. ? Do not let your hips go lower than your knees. ? Do not bend lower than told by your health care provider. ? If your knee pain increases, do not bend as low. 3. Hold the squat position for __________ seconds.  4. Slowly push with your legs to return to standing. Do not use your hands to pull yourself to standing. Repeat __________ times. Complete this exercise __________ times a day. Exercise J: Wall Slides (Quadriceps)  1. Lean your back against a smooth wall or door while you walk your feet out 18-24 inches (46-61 cm) from it. 2. Place your feet hip-width apart. 3. Slowly slide down the wall or door until your knees bend __________ degrees. Keep your knees over your heels, not over your toes. Keep your knees in line with your hips. 4. Hold for __________ seconds. Repeat __________ times. Complete this exercise __________ times a day. Exercise K: Straight Leg Raises - Hip Abductors 1. Lie on your side with your left / right leg in the top position. Lie so your head, shoulder, knee, and hip line up. You may bend your bottom knee to help you keep your balance. 2. Roll your hips slightly forward so your hips are stacked directly over each other and your left / right knee is facing forward. 3. Leading with your heel, lift your top leg 4-6 inches (10-15 cm). You should feel the  muscles in your outer hip lifting. ? Do not let your foot drift forward. ? Do not let your knee roll toward the ceiling. 4. Hold this position for __________ seconds. 5. Slowly return your leg to the starting position. 6. Let your muscles relax completely after each repetition. Repeat __________ times. Complete this exercise __________ times a day. Exercise L: Straight Leg Raises - Hip Extensors 1. Lie on your abdomen on a firm surface. You can put a pillow under your hips if that is more comfortable. 2. Tense the muscles in your buttocks and lift your left / right leg about 4-6 inches (10-15 cm). Keep your knee straight as you lift your leg. 3. Hold this position for __________ seconds. 4. Slowly lower your leg to the starting position. 5. Let your leg relax completely after each repetition. Repeat __________ times. Complete this exercise __________ times a day. This information is not intended to replace advice given to you by your health care provider. Make sure you discuss any questions you have with your health care provider. Document Released: 03/03/2005 Document Revised: 01/12/2016 Document Reviewed: 02/23/2015 Elsevier Interactive Patient Education  2018 [employer].

## 2017-10-28 ENCOUNTER — Other Ambulatory Visit: Payer: Self-pay | Admitting: Rheumatology

## 2017-10-28 NOTE — Telephone Encounter (Signed)
Last Visit: 08/25/17 Next Visit: 01/23/18 Labs: 08/15/17 cbc/cmp stable  Okay to refill per Dr. Corliss Skains

## 2018-01-09 NOTE — Progress Notes (Deleted)
Office Visit Note  Patient: Dominique Harvey             Date of Birth: 1970-12-17           MRN: [id number]             PCP: Charlies Silvers, PA-C Referring: Charlies Silvers, [place]* Visit Date: 01/23/2018 Occupation: @GUAROCC @  Subjective:  No chief complaint on file.   History of Present Illness: Dominique Harvey is a 47 y.o. female ***   Activities of Daily Living:  Patient reports morning stiffness for *** {minute/hour:19697}.   Patient {ACTIONS;DENIES/REPORTS:21021675::"Denies"} nocturnal pain.  Difficulty dressing/grooming: {ACTIONS;DENIES/REPORTS:21021675::"Denies"} Difficulty climbing stairs: {ACTIONS;DENIES/REPORTS:21021675::"Denies"} Difficulty getting out of chair: {ACTIONS;DENIES/REPORTS:21021675::"Denies"} Difficulty using hands for taps, buttons, cutlery, and/or writing: {ACTIONS;DENIES/REPORTS:21021675::"Denies"}  No Rheumatology ROS completed.   PMFS History:  Patient Active Problem List   Diagnosis Date Noted  . Primary osteoarthritis of both knees 03/17/2017  . 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 Rheumatoid arthritis involving multiple sites with positive rheumatoid factor (HCC) 07/14/2016  . SLE-Sjogren overlap syndrome (HCC) 07/14/2016  . High risk medication use 07/14/2016  . History of anemia 07/14/2016  . History of hypertension 07/14/2016  . History of anxiety 07/14/2016  . History of nephrolithiasis 07/14/2016  . Thalassemia minor 07/14/2016  . History of herpes genitalis 07/14/2016  . Palpitations   . Sinusitis   . Joint pain   . Upper respiratory infection   . Anxiety     Past Medical History:  Diagnosis Date  . Anxiety   . Joint pain   . Palpitations   . Sinusitis   . Upper respiratory infection     Family History  Problem Relation Age of Onset  . Mitral valve prolapse Mother   . Atrial fibrillation Mother   . Rheum arthritis Mother   .  Cancer Unknown        `  . Hypertension Unknown   . Leukemia Father   . Healthy Daughter   . Breast cancer Neg Hx    Past Surgical History:  Procedure Laterality Date  . CESAREAN SECTION  2011  . KIDNEY SURGERY     age 69   . TONSILLECTOMY     age 49   Social History   Social History Narrative  . Not on file    Objective: Vital Signs: There were no vitals taken for this visit.   Physical Exam   Musculoskeletal Exam: ***  CDAI Exam: CDAI Score: Not documented Patient Global Assessment: Not documented; Provider Global Assessment: Not documented Swollen: Not documented; Tender: Not documented Joint Exam   Not documented   There is currently no information documented on the homunculus. Go to the Rheumatology activity and complete the homunculus joint exam.  Investigation: No additional findings.  Imaging: No results found.  Recent Labs: Lab Results  Component Value Date   WBC 4.7 08/15/2017   HGB 10.7 (L) 08/15/2017   PLT 331 08/15/2017   NA 139 08/15/2017   K 3.9 08/15/2017   CL 107 08/15/2017   CO2 25 08/15/2017   GLUCOSE 82 08/15/2017   BUN 13 08/15/2017   CREATININE 0.75 08/15/2017   BILITOT 0.5 08/15/2017   ALKPHOS 69 08/15/2017   AST 17 08/15/2017   ALT 15 08/15/2017   PROT 7.7 08/15/2017   ALBUMIN 4.0 08/15/2017   CALCIUM 9.7 08/15/2017   GFRAA >60 08/15/2017    Speciality Comments: No specialty comments available.  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   Primary osteoarthritis of both knees  History of thalassemia minor  History of hypertension  History of anxiety  History of nephrolithiasis  History of anemia   Orders: No orders of the defined types were placed in this encounter.  No orders  of the defined types were placed in this encounter.   Face-to-face time spent with patient was *** minutes. Greater than 50% of time was spent in counseling and coordination of care.  Follow-Up Instructions: No follow-ups on file.   Gearldine Bienenstock, PA-C  Note - This record has been created using Dragon software.  Chart creation errors have been sought, but may not always  have been located. Such creation errors do not reflect on  the standard of medical care.

## 2018-01-22 ENCOUNTER — Other Ambulatory Visit: Payer: Self-pay | Admitting: Rheumatology

## 2018-01-23 ENCOUNTER — Ambulatory Visit: Payer: BLUE CROSS/BLUE SHIELD | Admitting: Physician Assistant

## 2018-01-23 NOTE — Telephone Encounter (Addendum)
Last Visit: 08/25/17 Next Visit: 02/15/18 Labs:08/15/17 cbc/cmp stable  Attempted to contact patient and left message to advise patient she is due for labs.   Okay to refill 30 day supply Imuran?

## 2018-01-23 NOTE — Telephone Encounter (Signed)
Ok to refill 30-day supply

## 2018-01-25 ENCOUNTER — Other Ambulatory Visit: Payer: Self-pay

## 2018-01-25 DIAGNOSIS — Z79899 Other long term (current) drug therapy: Secondary | ICD-10-CM

## 2018-01-26 LAB — COMPLETE METABOLIC PANEL WITH GFR
AG RATIO: 1.7 (calc) (ref 1.0–2.5)
ALKALINE PHOSPHATASE (APISO): 65 U/L (ref 33–115)
ALT: 10 U/L (ref 6–29)
AST: 13 U/L (ref 10–35)
Albumin: 4.5 g/dL (ref 3.6–5.1)
BILIRUBIN TOTAL: 0.4 mg/dL (ref 0.2–1.2)
BUN: 15 mg/dL (ref 7–25)
CALCIUM: 9.2 mg/dL (ref 8.6–10.2)
CHLORIDE: 105 mmol/L (ref 98–110)
CO2: 26 mmol/L (ref 20–32)
Creat: 0.69 mg/dL (ref 0.50–1.10)
GFR, Est African American: 122 mL/min/{1.73_m2} (ref >=60)
GFR, Est Non African American: 105 mL/min/{1.73_m2} (ref >=60)
GLOBULIN: 2.6 g/dL (ref 1.9–3.7)
Glucose, Bld: 112 mg/dL — ABNORMAL HIGH (ref 65–99)
POTASSIUM: 4.6 mmol/L (ref 3.5–5.3)
SODIUM: 137 mmol/L (ref 135–146)
Total Protein: 7.1 g/dL (ref 6.1–8.1)

## 2018-01-26 LAB — CBC WITH DIFFERENTIAL/PLATELET
BASOS ABS: 8 {cells}/uL (ref 0–200)
BASOS PCT: 0.2 %
EOS ABS: 90 {cells}/uL (ref 15–500)
EOS PCT: 2.2 %
HEMATOCRIT: 34.7 % — AB (ref 35.0–45.0)
Hemoglobin: 10.6 g/dL — ABNORMAL LOW (ref 11.7–15.5)
Lymphs Abs: 476 cells/uL — ABNORMAL LOW (ref 850–3900)
MCH: 20.3 pg — ABNORMAL LOW (ref 27.0–33.0)
MCHC: 30.5 g/dL — ABNORMAL LOW (ref 32.0–36.0)
MCV: 66.6 fL — ABNORMAL LOW (ref 80.0–100.0)
MONOS PCT: 12.8 %
Neutro Abs: 3001 cells/uL (ref 1500–7800)
Neutrophils Relative %: 73.2 %
PLATELETS: 323 10*3/uL (ref 140–400)
RBC: 5.21 10*6/uL — ABNORMAL HIGH (ref 3.80–5.10)
RDW: 16.5 % — AB (ref 11.0–15.0)
TOTAL LYMPHOCYTE: 11.6 %
WBC mixed population: 525 cells/uL (ref 200–950)
WBC: 4.1 10*3/uL (ref 3.8–10.8)

## 2018-01-26 LAB — CBC MORPHOLOGY

## 2018-01-26 NOTE — Progress Notes (Signed)
Labs are stable.  She has chronic anemia.

## 2018-02-01 NOTE — Progress Notes (Signed)
Office Visit Note  Patient: Dominique Harvey             Date of Birth: 01-08-1971           MRN: [id number]             PCP: Charlies Silvers, PA-C Referring: Charlies Silvers, [place]* Visit Date: 02/15/2018 Occupation: @GUAROCC @  Subjective:  Congestion and cough   History of Present Illness: Dominique Harvey is a 47 y.o. female with history of autoimmune disease and osteoarthritis.  Patient is on Imuran 100 mg by mouth daily.  She denies any recent autoimmune flares.  She denies any symptoms of Raynaud's or recent rashes.  She continues to have some sensitivity and uses sunscreen as well as avoiding primaries of sunlight.  She continues to have sicca symptoms.  She denies any recent oral ulcerations and continues to take L-lysine.  She reports that she has occasional discomfort in bilateral hands which is situational depending on her level of activity.  She says she is no swelling decreased grip strength.  She denies any swelling in her hands at this time.  She says she is been having increased discomfort in bilateral knee joints but has not started working out at least 3 times weekly.  She says she has some discomfort with steps but denies any joint swelling.  She states she is also having discomfort in bilateral elbow joints worse in the right elbow.  She states that it is exacerbated by standing.  She reports she continues to have good range of motion denies any other joint swelling.  She reports that since Sunday night she has had congestion, cough, sore throat.  She denies any fevers.  She states that she is taking Mucinex which is helping with the congestion.  She states that she had her flu shot last Wednesday.  She states that she has been using Zicam swabs as well as taking elderberry.  She has continue taking Imuran 100 mg by mouth daily.  She has not followed up with her primary care yet. She reports that she recently missed her and found out that she was pregnant.  She states  that she recently had bleeding and was passing clots, and she had lab work on 02/06/2018 that was consistent with a miscarriage.     Activities of Daily Living:  Patient reports morning stiffness for 1 minute.   Patient Denies nocturnal pain.  Difficulty dressing/grooming: Denies Difficulty climbing stairs: Reports Difficulty getting out of chair: Denies Difficulty using hands for taps, buttons, cutlery, and/or writing: Reports  Review of Systems  Constitutional: Negative for fatigue.  HENT: Positive for sore throat and mouth dryness. Negative for mouth sores and nose dryness.   Eyes: Positive for dryness. Negative for pain and visual disturbance.  Respiratory: Positive for cough (and congestion). Negative for hemoptysis, shortness of breath and difficulty breathing.   Cardiovascular: Negative for chest pain, palpitations, hypertension and swelling in legs/feet.  Gastrointestinal: Negative for blood in stool, constipation and diarrhea.  Endocrine: Negative for increased urination.  Genitourinary: Negative for difficulty urinating and painful urination.  Musculoskeletal: Positive for morning stiffness. Negative for arthralgias, joint pain, joint swelling, myalgias, muscle weakness, muscle tenderness and myalgias.  Skin: Negative for color change, pallor, rash, hair loss, nodules/bumps, skin tightness, ulcers and sensitivity to sunlight.  Allergic/Immunologic: Negative for susceptible to infections.  Neurological: Negative for dizziness, numbness, headaches and weakness.  Hematological: Negative for bruising/bleeding tendency and swollen glands.  Psychiatric/Behavioral: Negative for depressed mood  and sleep disturbance. The patient is not nervous/anxious.     PMFS History:  Patient Active Problem List   Diagnosis Date Noted  . Primary osteoarthritis of both knees 03/17/2017  . 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 Rheumatoid arthritis involving multiple sites with positive rheumatoid factor (HCC) 07/14/2016  . SLE-Sjogren overlap syndrome (HCC) 07/14/2016  . High risk medication use 07/14/2016  . History of anemia 07/14/2016  . History of hypertension 07/14/2016  . History of anxiety 07/14/2016  . History of nephrolithiasis 07/14/2016  . Thalassemia minor 07/14/2016  . History of herpes genitalis 07/14/2016  . Palpitations   . Sinusitis   . Joint pain   . Upper respiratory infection   . Anxiety     Past Medical History:  Diagnosis Date  . Anemia   . Anxiety   . Hypertension   . Joint pain   . Palpitations   . RA (rheumatoid arthritis) (HCC)   . Sinusitis   . Thalassemia minor   . Upper respiratory infection     Family History  Problem Relation Age of Onset  . Mitral valve prolapse Mother   . Atrial fibrillation Mother   . Rheum arthritis Mother   . Cancer Unknown        `  . Hypertension Unknown   . Leukemia Father   . Healthy Daughter   . Breast cancer Neg Hx    Past Surgical History:  Procedure Laterality Date  . CESAREAN SECTION  2011  . KIDNEY SURGERY     age 31   . THERAPEUTIC ABORTION    . TONSILLECTOMY     age 89  . WISDOM TOOTH EXTRACTION     Social History   Social History Narrative  . Not on file    Objective: Vital Signs: BP (!) 155/75 (BP Location: Left Arm, Patient Position: Sitting, Cuff Size: Normal)   Pulse 97   Resp 16   Ht 5\' 8"  (1.727 m)   Wt 179 lb 12.8 oz (81.6 kg)   LMP 12/30/2017   BMI 27.34 kg/m    Physical Exam  Constitutional: She is oriented to person, place, and time. She appears well-developed and well-nourished.  HENT:  Head: Normocephalic and atraumatic.  No oral or nasal ulcerations. No parotid swelling.  Eyes: Conjunctivae and EOM are normal.  Neck: Normal range of motion.  Cardiovascular: Normal rate, regular rhythm, normal heart sounds and intact distal pulses.  Pulmonary/Chest: Effort normal and breath  sounds normal.  Abdominal: Soft. Bowel sounds are normal.  Lymphadenopathy:    She has no cervical adenopathy.  Neurological: She is alert and oriented to person, place, and time.  Skin: Skin is warm and dry. Capillary refill takes less than 2 seconds.  Mild facial erythema. No digital ulcerations or signs of gangrene.   Psychiatric: She has a normal mood and affect. Her behavior is normal.  Nursing note and vitals reviewed.    Musculoskeletal Exam: C-spine, thoracic spine, lumbar spine good range of motion.  No midline spinal tenderness.  No SI joint tenderness.  Shoulder joints, elbow joints, wrist joints, MCPs, PIPs, DIPs good range of motion with no synovitis.  She is complete fist formation bilaterally.  She has bilateral lateral epicondylitis.  Hip joints, knee joints, ankle joints, MTPs, PIPs, DIPs good range of motion no synovitis.  No warmth or effusion bilateral knee joints.  Has bilateral knee crepitus.  No tenderness or swelling  of ankle joints.  No Achilles tendinitis or plantar fasciitis.  No tenderness of trochanteric bursa bilaterally.  CDAI Exam: CDAI Score: Not documented Patient Global Assessment: Not documented; Provider Global Assessment: Not documented Swollen: Not documented; Tender: Not documented Joint Exam   Not documented   There is currently no information documented on the homunculus. Go to the Rheumatology activity and complete the homunculus joint exam.  Investigation: No additional findings.  Imaging: No results found.  Recent Labs: Lab Results  Component Value Date   WBC 6.0 02/06/2018   HGB 11.3 (L) 02/06/2018   PLT 281 02/06/2018   NA 137 01/25/2018   K 4.6 01/25/2018   CL 105 01/25/2018   CO2 26 01/25/2018   GLUCOSE 112 (H) 01/25/2018   BUN 15 01/25/2018   CREATININE 0.69 01/25/2018   BILITOT 0.4 01/25/2018   ALKPHOS 69 08/15/2017   AST 13 01/25/2018   ALT 10 01/25/2018   PROT 7.1 01/25/2018   ALBUMIN 4.0 08/15/2017   CALCIUM 9.2  01/25/2018   GFRAA 122 01/25/2018    Speciality Comments: No specialty comments available.  Procedures:  No procedures performed Allergies: Sulfa antibiotics   Assessment / Plan:     Visit Diagnoses: Autoimmune disease Positive ANA, positive Ro, positive RF positive 14-3-3 eta antibody and positive sicca -she has not had any recent autoimmune flares.  She is clinically doing well on Imuran 100 mg by mouth daily.  No synovitis noted.  She has been having increased discomfort in bilateral knee joints but has been working out 3 times weekly.  No warmth or effusion was noted.  She continues to have sicca symptoms.  She has not had any recent oral nasal ulcerations.  She takes L-lysine which helps prevent oral ulcerations.  She has not had any recent rashes.  She has mild facial erythema.  She has not experienced any symptoms of Raynaud's recently.  No digital ulcerations or signs of gangrene were noted.  She currently has an upper respiratory tract infection.  She has been experiencing cough, congestion, sore throat but does not experience any fevers.  She has not been evaluated by her primary care.  She was advised to follow-up with primary care she continues to be symptomatic or develop a fever.  She has been taking Mucinex.  She was encouraged to hold her dose of Imuran if she continues to be symptomatic.  A refill of Imuran 100 mg by mouth daily was sent to the pharmacy.  The following labs will be obtained today.  She was advised to notify us if she develops any new or worsening symptoms.  She will follow-up in the office in 5 months.  Plan: COMPLETE METABOLIC PANEL WITH GFR, Anti-DNA antibody, double-stranded, C3 and C4, Sedimentation rate  High risk medication use -  Imuran 100 mg by mouth daily.  CBC and CMP were drawn today to monitor for drug toxicity.  She will return in January and every 3 months for lab work.- Plan: COMPLETE METABOLIC PANEL WITH GFR  Primary osteoarthritis of both knees: No  warmth or effusion.  She is good range of motion with no discomfort.  She has bilateral knee crepitus.  She is been having increased discomfort in bilateral knee joints especially when going up and down steps.  She has been working out at least 3 times weekly which has been causing increased discomfort in bilateral knee joints.  Lateral epicondylitis of both elbows: She has tenderness bilaterally.  Treatment options were discussed.  She was  given a handout of exercises that she can perform at home.  We also discussed trying to wear a lateral epicondylitis brace.  If she continues to have discomfort we discussed scheduling a cortisone injection in the future.  We also discussed the option of physical therapy but she would like to hold off at this time.  History of anemia: She recently had a miscarriage.  She had lab work performed on 02/06/2018.  Hemoglobin was 11.3.  Beta thalassemia (HCC) -She was evaluated by Dr. Myna Hidalgo. Patient requested a refill of folic acid.  She takes folic acid 1 mg by mouth daily.  Plan: folic acid (FOLVITE) 1 MG tablet   Other medical conditions are listed as follows:  History of hypertension  History of anxiety  History of thalassemia minor - Dr. Myna Hidalgo.   History of nephrolithiasis   Orders: Orders Placed This Encounter  Procedures  . COMPLETE METABOLIC PANEL WITH GFR  . Anti-DNA antibody, double-stranded  . C3 and C4  . Sedimentation rate   Meds ordered this encounter  Medications  . azaTHIOprine (IMURAN) 50 MG tablet    Sig: Take 2 tablets (100 mg total) by mouth at bedtime.    Dispense:  180 tablet    Refill:  0  . folic acid (FOLVITE) 1 MG tablet    Sig: Take 1 tablet (1 mg total) by mouth daily.    Dispense:  30 tablet    Refill:  11    Face-to-face time spent with patient was 30 minutes. Greater than 50% of time was spent in counseling and coordination of care.  Follow-Up Instructions: Return in about 5 months (around 07/17/2018) for  Autoimmune Disease, Osteoarthritis.   Gearldine Bienenstock, PA-C  Note - This record has been created using Dragon software.  Chart creation errors have been sought, but may not always  have been located. Such creation errors do not reflect on  the standard of medical care.

## 2018-02-06 ENCOUNTER — Inpatient Hospital Stay (HOSPITAL_COMMUNITY)
Admission: AD | Admit: 2018-02-06 | Discharge: 2018-02-06 | Disposition: A | Discharge status: Home / Self Care | Payer: BLUE CROSS/BLUE SHIELD | Source: Ambulatory Visit | Attending: Obstetrics & Gynecology | Admitting: Obstetrics & Gynecology

## 2018-02-06 ENCOUNTER — Encounter (HOSPITAL_COMMUNITY): Payer: Self-pay | Admitting: *Deleted

## 2018-02-06 ENCOUNTER — Other Ambulatory Visit: Payer: Self-pay

## 2018-02-06 DIAGNOSIS — O161 Unspecified maternal hypertension, first trimester: Secondary | ICD-10-CM | POA: Insufficient documentation

## 2018-02-06 DIAGNOSIS — R109 Unspecified abdominal pain: Secondary | ICD-10-CM | POA: Diagnosis present

## 2018-02-06 DIAGNOSIS — F419 Anxiety disorder, unspecified: Secondary | ICD-10-CM | POA: Diagnosis not present

## 2018-02-06 DIAGNOSIS — Z3A01 Less than 8 weeks gestation of pregnancy: Secondary | ICD-10-CM | POA: Diagnosis not present

## 2018-02-06 DIAGNOSIS — O99341 Other mental disorders complicating pregnancy, first trimester: Secondary | ICD-10-CM | POA: Insufficient documentation

## 2018-02-06 DIAGNOSIS — M069 Rheumatoid arthritis, unspecified: Secondary | ICD-10-CM | POA: Diagnosis not present

## 2018-02-06 DIAGNOSIS — Z6790 Unspecified blood type, Rh positive: Secondary | ICD-10-CM

## 2018-02-06 DIAGNOSIS — D563 Thalassemia minor: Secondary | ICD-10-CM | POA: Diagnosis not present

## 2018-02-06 DIAGNOSIS — Z87891 Personal history of nicotine dependence: Secondary | ICD-10-CM | POA: Insufficient documentation

## 2018-02-06 DIAGNOSIS — Z8249 Family history of ischemic heart disease and other diseases of the circulatory system: Secondary | ICD-10-CM | POA: Diagnosis not present

## 2018-02-06 DIAGNOSIS — Z79899 Other long term (current) drug therapy: Secondary | ICD-10-CM | POA: Diagnosis not present

## 2018-02-06 DIAGNOSIS — O039 Complete or unspecified spontaneous abortion without complication: Secondary | ICD-10-CM | POA: Diagnosis not present

## 2018-02-06 DIAGNOSIS — N939 Abnormal uterine and vaginal bleeding, unspecified: Secondary | ICD-10-CM | POA: Diagnosis present

## 2018-02-06 HISTORY — DX: Essential (primary) hypertension: I10

## 2018-02-06 HISTORY — DX: Thalassemia minor: D56.3

## 2018-02-06 HISTORY — DX: Rheumatoid arthritis, unspecified: M06.9

## 2018-02-06 HISTORY — DX: Anemia, unspecified: D64.9

## 2018-02-06 LAB — CBC
HEMATOCRIT: 34.8 % — AB (ref 36.0–46.0)
HEMOGLOBIN: 11.3 g/dL — AB (ref 12.0–15.0)
MCH: 21 pg — ABNORMAL LOW (ref 26.0–34.0)
MCHC: 32.5 g/dL (ref 30.0–36.0)
MCV: 64.6 fL — AB (ref 78.0–100.0)
Platelets: 281 10*3/uL (ref 150–400)
RBC: 5.39 MIL/uL — ABNORMAL HIGH (ref 3.87–5.11)
RDW: 15.1 % (ref 11.5–15.5)
WBC: 6 10*3/uL (ref 4.0–10.5)

## 2018-02-06 LAB — URINALYSIS, ROUTINE W REFLEX MICROSCOPIC
Bilirubin Urine: NEGATIVE
GLUCOSE, UA: NEGATIVE mg/dL
Hgb urine dipstick: NEGATIVE
KETONES UR: NEGATIVE mg/dL
LEUKOCYTES UA: NEGATIVE
Nitrite: NEGATIVE
PH: 6 (ref 5.0–8.0)
Protein, ur: NEGATIVE mg/dL
SPECIFIC GRAVITY, URINE: 1.011 (ref 1.005–1.030)

## 2018-02-06 LAB — POCT PREGNANCY, URINE: Preg Test, Ur: NEGATIVE

## 2018-02-06 LAB — HCG, QUANTITATIVE, PREGNANCY: hCG, Beta Chain, Quant, S: 5 m[IU]/mL — ABNORMAL HIGH (ref <5)

## 2018-02-06 NOTE — MAU Note (Addendum)
Period was 8 days late.  Thought it was coming the way she felt, cramping and boobs really hurt.  Last Wed  2+HPT. THurs, started lightly bleeding, then started cramping.  Then started passing clots., that continued until late Sat. Yesterday felt pretty normal.  Some bleeding brownish red, continues.  Called phys, was instructed to come here.  preg test last night was very faint

## 2018-02-06 NOTE — Discharge Instructions (Signed)

## 2018-02-06 NOTE — MAU Provider Note (Signed)
History     CSN: [id number]  Arrival date and time: 02/06/18 1623   First Provider Initiated Contact with Patient 02/06/18 1934      Chief Complaint  Patient presents with  . Vaginal Bleeding  . Abdominal Pain  . Possible Pregnancy   [id number] @[redacted]w[redacted]d  by LMP here with bleeding. Reports bleeding started 3 days ago. She was bleeding heavy and passing clots for 2 days. Today bleeding has been scant pink and brown. Had some cramping at that time, none now. She had +HPT last week. She was not trying to conceive and does not desire future pregnancies. States her PCP took her of birth control because it was too risky.  Past Medical History:  Diagnosis Date  . Anemia   . Anxiety   . Hypertension   . Joint pain   . Palpitations   . RA (rheumatoid arthritis) (HCC)   . Sinusitis   . Thalassemia minor   . Upper respiratory infection     Past Surgical History:  Procedure Laterality Date  . CESAREAN SECTION  2011  . KIDNEY SURGERY     age 47   . THERAPEUTIC ABORTION    . TONSILLECTOMY     age 48  . WISDOM TOOTH EXTRACTION      Family History  Problem Relation Age of Onset  . Mitral valve prolapse Mother   . Atrial fibrillation Mother   . Rheum arthritis Mother   . Cancer Unknown        `  . Hypertension Unknown   . Leukemia Father   . Healthy Daughter   . Breast cancer Neg Hx     Social History   Tobacco Use  . Smoking status: Former Smoker    Packs/day: 1.00    Years: 5.00    Pack years: 5.00    Types: Cigarettes    Last attempt to quit: 05/03/1992    Years since quitting: 25.7  . Smokeless tobacco: Never Used  Substance Use Topics  . Alcohol use: Yes    Comment: rarely   . Drug use: No    Allergies:  Allergies  Allergen Reactions  . Sulfa Antibiotics Nausea And Vomiting and Rash    unknown  unknown     Medications Prior to Admission  Medication Sig Dispense Refill Last Dose  . acyclovir (ZOVIRAX) 400 MG tablet Take 400 mg 2 (two) times daily by mouth.    Taking  . ALPRAZolam (XANAX) 0.5 MG tablet Take 0.25 mg by mouth as needed.    Taking  . azaTHIOprine (IMURAN) 50 MG tablet TAKE 2 TABLETS BY MOUTH AT BEDTIME 60 tablet 0   . cetirizine (ZYRTEC) 10 MG tablet Take 10 mg by mouth daily.   Taking  . Cholecalciferol (VITAMIN D-1000 MAX ST) 1000 units tablet Take by mouth.   Taking  . DOCOSAHEXAENOIC ACID PO Take by mouth.   Taking  . folic acid (FOLVITE) 1 MG tablet Take 1 tablet (1 mg total) by mouth daily. 30 tablet 11 Taking  . L-Lysine 500 MG TABS Take by mouth.   Taking  . Multiple Vitamin (MULTIVITAMIN) capsule Take 1 capsule by mouth daily.   Taking  . Omega-3 Fatty Acids (FISH OIL) 500 MG CAPS Take 2 (two) times daily by mouth.   Taking  . propranolol (INDERAL) 10 MG tablet Take 1 tablet (10 mg total) by mouth daily as needed. 30 tablet 3 Taking  . venlafaxine (EFFEXOR) 75 MG tablet 3 tabs po qd   Taking  .  vitamin C (ASCORBIC ACID) 500 MG tablet Take 500 mg by mouth.   Taking    Review of Systems  Gastrointestinal: Negative for abdominal pain.  Genitourinary: Positive for vaginal bleeding.   Physical Exam   Blood pressure (!) 146/98, pulse 93, temperature 98.9 F (37.2 C), temperature source Oral, resp. rate 18, weight 78.7 kg, last menstrual period 12/30/2017, SpO2 100 %.  Physical Exam  Constitutional: She is oriented to person, place, and time. She appears well-developed and well-nourished. No distress.  HENT:  Head: Normocephalic and atraumatic.  Neck: Normal range of motion.  Cardiovascular: Normal rate.  Respiratory: Effort normal. No respiratory distress.  GI: Soft. She exhibits no distension and no mass. There is no tenderness. There is no rebound and no guarding.  Musculoskeletal: Normal range of motion.  Neurological: She is alert and oriented to person, place, and time.  Skin: Skin is warm and dry.  Psychiatric: She has a normal mood and affect.   Results for orders placed or performed during the hospital  encounter of 02/06/18 (from the past 24 hour(s))  Urinalysis, Routine w reflex microscopic     Status: Abnormal   Collection Time: 02/06/18  5:06 PM  Result Value Ref Range   Color, Urine STRAW (A) YELLOW   APPearance CLEAR CLEAR   Specific Gravity, Urine 1.011 1.005 - 1.030   pH 6.0 5.0 - 8.0   Glucose, UA NEGATIVE NEGATIVE mg/dL   Hgb urine dipstick NEGATIVE NEGATIVE   Bilirubin Urine NEGATIVE NEGATIVE   Ketones, ur NEGATIVE NEGATIVE mg/dL   Protein, ur NEGATIVE NEGATIVE mg/dL   Nitrite NEGATIVE NEGATIVE   Leukocytes, UA NEGATIVE NEGATIVE  Pregnancy, urine POC     Status: None   Collection Time: 02/06/18  5:14 PM  Result Value Ref Range   Preg Test, Ur NEGATIVE NEGATIVE  hCG, quantitative, pregnancy     Status: Abnormal   Collection Time: 02/06/18  6:59 PM  Result Value Ref Range   hCG, Beta Chain, Quant, S 5 (H) <5 mIU/mL  CBC     Status: Abnormal   Collection Time: 02/06/18  6:59 PM  Result Value Ref Range   WBC 6.0 4.0 - 10.5 K/uL   RBC 5.39 (H) 3.87 - 5.11 MIL/uL   Hemoglobin 11.3 (L) 12.0 - 15.0 g/dL   HCT 75.6 (L) 43.3 - 29.5 %   MCV 64.6 (L) 78.0 - 100.0 fL   MCH 21.0 (L) 26.0 - 34.0 pg   MCHC 32.5 30.0 - 36.0 g/dL   RDW 18.8 41.6 - 60.6 %   Platelets 281 150 - 400 K/uL   MAU Course  Procedures  MDM Labs ordered and reviewed. Quant 5 today, likely trending down since had +UPT last week. Discussed findings with pt. Stable for discharge home.  Assessment and Plan   1. SAB (spontaneous abortion)   2. Blood type, Rh positive    Discharge home Follow up with [facility] in 1 week (will need to reestablish care) Abstinence or condoms Bleeding/return precautions Ibuprofen prn  Allergies as of 02/06/2018      Reactions   Sulfa Antibiotics Nausea And Vomiting, Rash   unknown unknown      Medication List    TAKE these medications   acyclovir 400 MG tablet Commonly known as:  ZOVIRAX Take 400 mg 2 (two) times daily by mouth.   ALPRAZolam 0.5 MG  tablet Commonly known as:  XANAX Take 0.25 mg by mouth as needed.   azaTHIOprine 50 MG tablet  Commonly known as:  IMURAN TAKE 2 TABLETS BY MOUTH AT BEDTIME   cetirizine 10 MG tablet Commonly known as:  ZYRTEC Take 10 mg by mouth daily.   DOCOSAHEXAENOIC ACID PO Take by mouth.   Fish Oil 500 MG Caps Take 2 (two) times daily by mouth.   folic acid 1 MG tablet Commonly known as:  FOLVITE Take 1 tablet (1 mg total) by mouth daily.   L-Lysine 500 MG Tabs Take by mouth.   multivitamin capsule Take 1 capsule by mouth daily.   propranolol 10 MG tablet Commonly known as:  INDERAL Take 1 tablet (10 mg total) by mouth daily as needed.   venlafaxine 75 MG tablet Commonly known as:  EFFEXOR 3 tabs po qd   vitamin C 500 MG tablet Commonly known as:  ASCORBIC ACID Take 500 mg by mouth.   VITAMIN D-1000 MAX ST 1000 units tablet Generic drug:  Cholecalciferol Take by mouth.      Donette Larry, CNM 02/06/2018, 7:47 PM

## 2018-02-15 ENCOUNTER — Encounter: Payer: Self-pay | Admitting: Physician Assistant

## 2018-02-15 ENCOUNTER — Ambulatory Visit (INDEPENDENT_AMBULATORY_CARE_PROVIDER_SITE_OTHER): Payer: BLUE CROSS/BLUE SHIELD | Admitting: Physician Assistant

## 2018-02-15 VITALS — BP 155/75 | HR 97 | Resp 16 | Ht 68.0 in | Wt 179.8 lb

## 2018-02-15 DIAGNOSIS — M7712 Lateral epicondylitis, left elbow: Secondary | ICD-10-CM

## 2018-02-15 DIAGNOSIS — Z8679 Personal history of other diseases of the circulatory system: Secondary | ICD-10-CM

## 2018-02-15 DIAGNOSIS — M170 Bilateral primary osteoarthritis of knee: Secondary | ICD-10-CM | POA: Diagnosis not present

## 2018-02-15 DIAGNOSIS — M7711 Lateral epicondylitis, right elbow: Secondary | ICD-10-CM

## 2018-02-15 DIAGNOSIS — D561 Beta thalassemia: Secondary | ICD-10-CM

## 2018-02-15 DIAGNOSIS — Z79899 Other long term (current) drug therapy: Secondary | ICD-10-CM

## 2018-02-15 DIAGNOSIS — Z862 Personal history of diseases of the blood and blood-forming organs and certain disorders involving the immune mechanism: Secondary | ICD-10-CM

## 2018-02-15 DIAGNOSIS — M359 Systemic involvement of connective tissue, unspecified: Secondary | ICD-10-CM | POA: Diagnosis not present

## 2018-02-15 DIAGNOSIS — Z8659 Personal history of other mental and behavioral disorders: Secondary | ICD-10-CM

## 2018-02-15 DIAGNOSIS — Z87442 Personal history of urinary calculi: Secondary | ICD-10-CM

## 2018-02-15 MED ORDER — FOLIC ACID 1 MG PO TABS: 1.0000 mg | ORAL_TABLET | Freq: Every day | ORAL | 11 refills | Status: DC

## 2018-02-15 MED ORDER — AZATHIOPRINE 50 MG PO TABS: 100.0000 mg | ORAL_TABLET | Freq: Every day | ORAL | 0 refills | Status: DC

## 2018-02-15 NOTE — Patient Instructions (Addendum)
Standing Labs We placed an order today for your standing lab work.    Please come back and get your standing labs in January and every 3 months   We have open lab Monday through Friday from 8:30-11:30 AM and 1:30-4:00 PM  at the office of Dr. Pollyann Savoy.   You may experience shorter wait times on Monday and Friday afternoons. The office is located at [address], [address], [place], [place] [address] No appointment is necessary.   Labs are drawn by [facility].  You may receive a bill from [facility] for your lab work. If you have any questions regarding directions or hours of operation,  please call [phone].   Just as a reminder please drink plenty of water prior to coming for your lab work. Thanks!   Tennis Elbow Rehab Ask your health care provider which exercises are safe for you. Do exercises exactly as told by your health care provider and adjust them as directed. It is normal to feel mild stretching, pulling, tightness, or discomfort as you do these exercises, but you should stop right away if you feel sudden pain or your pain gets worse. Do not begin these exercises until told by your health care provider. Stretching and range of motion exercises These exercises warm up your muscles and joints and improve the movement and flexibility of your elbow. These exercises also help to relieve pain, numbness, and tingling. Exercise A: Wrist extensor stretch 1. Extend your left / right elbow with your fingers pointing down. 2. Gently pull the palm of your left / right hand toward you until you feel a gentle stretch on the top of your forearm. 3. To increase the stretch, push your left / right hand toward the outer edge or pinkie side of your forearm. 4. Hold this position for __________ seconds. Repeat __________ times. Complete this exercise __________ times a day. If directed by your health care provider, repeat this stretch except do it with a bent elbow this time. Exercise B: Wrist  flexor stretch  1. Extend your left / right elbow and turn your palm upward. 2. Gently pull your left / right palm and fingertips back so your wrist extends and your fingers point more toward the ground. 3. You should feel a gentle stretch on the inside of your forearm. 4. Hold this position for __________ seconds. Repeat __________ times. Complete this exercise __________ times a day. If directed by your health care provider, repeat this stretch except do it with a bent elbow this time. Strengthening exercises These exercises build strength and endurance in your elbow. Endurance is the ability to use your muscles for a long time, even after they get tired. Exercise C: Wrist extensors  1. Sit with your left / right forearm palm-down and fully supported on a table or countertop. Your elbow should be resting below the height of your shoulder. 2. Let your left / right wrist extend over the edge of the surface. 3. Loosely hold a __________ weight or a piece of rubber exercise band or tubing in your left / right hand. Slowly curl your left / right hand up toward your forearm. If you are using band or tubing, hold the band or tubing in place with your other hand to provide resistance. 4. Hold this position for __________ seconds. 5. Slowly return to the starting position. Repeat __________ times. Complete this exercise __________ times a day. Exercise D: Radial deviators  1. Stand with a __________ weight in your left /  righthand. Or, sit while holding a rubber exercise band or tubing with your other arm supported on a table or countertop. Position your hand so your thumb is on top. 2. Raise your hand upward in front of you so your thumb travels toward your forearm, or pull up on the rubber tubing. 3. Hold this position for __________ seconds. 4. Slowly return to the starting position. Repeat __________ times. Complete this exercise __________ times a day. Exercise E: Eccentric wrist  extensors 1. Sit with your left / right forearm palm-down and fully supported on a table or countertop. Your elbow should be resting below the height of your shoulder. 2. If told by your health care provider, hold a __________ weight in your hand. 3. Let your left / right wrist extend over the edge of the surface. 4. Use your other hand to lift up your left / right hand toward your forearm. Keep your forearm on the table. 5. Using only the muscles in your left / right hand, slowly lower your hand back down to the starting position. Repeat __________ times. Complete this exercise __________ times a day. This information is not intended to replace advice given to you by your health care provider. Make sure you discuss any questions you have with your health care provider. Document Released: 04/19/2005 Document Revised: 12/24/2015 Document Reviewed: 01/16/2015 Elsevier Interactive Patient Education  2018 [employer].  Tennis Elbow Tennis elbow is puffiness (inflammation) of the outer tendons of your forearm close to your elbow. Your tendons attach your muscles to your bones. Tennis elbow can happen in any sport or job in which you use your elbow too much. It is caused by doing the same motion over and over. Tennis elbow can cause:  Pain and tenderness in your forearm and the outer part of your elbow.  A burning feeling. This runs from your elbow through your arm.  Weak grip in your hands.  Follow these instructions at home: Activity  Rest your elbow and wrist as told by your doctor. Try to avoid any activities that caused the problem until your doctor says that you can do them again.  If a physical therapist teaches you exercises, do all of them as told.  If you lift an object, lift it with your palm facing up. This is easier on your elbow. Lifestyle  If your tennis elbow is caused by sports, check your equipment and make sure that: ? You are using it correctly. ? It fits you  well.  If your tennis elbow is caused by work, take breaks often, if you are able. Talk with your manager about doing your work in a way that is safe for you. ? If your tennis elbow is caused by computer use, talk with your manager about any changes that can be made to your work setup. General instructions  If told, apply ice to the painful area: ? Put ice in a plastic bag. ? Place a towel between your skin and the bag. ? Leave the ice on for 20 minutes, 2-3 times per day.  Take medicines only as told by your doctor.  If you were given a brace, wear it as told by your doctor.  Keep all follow-up visits as told by your doctor. This is important. Contact a doctor if:  Your pain does not get better with treatment.  Your pain gets worse.  You have weakness in your forearm, hand, or fingers.  You cannot feel your forearm, hand, or fingers. This  information is not intended to replace advice given to you by your health care provider. Make sure you discuss any questions you have with your health care provider. Document Released: 10/07/2009 Document Revised: 12/18/2015 Document Reviewed: 04/15/2014 Elsevier Interactive Patient Education  [employer].

## 2018-02-16 LAB — COMPLETE METABOLIC PANEL WITH GFR
AG RATIO: 1.6 (calc) (ref 1.0–2.5)
ALKALINE PHOSPHATASE (APISO): 71 U/L (ref 33–115)
ALT: 6 U/L (ref 6–29)
AST: 8 U/L — AB (ref 10–35)
Albumin: 4.1 g/dL (ref 3.6–5.1)
BILIRUBIN TOTAL: 0.2 mg/dL (ref 0.2–1.2)
BUN: 12 mg/dL (ref 7–25)
CHLORIDE: 103 mmol/L (ref 98–110)
CO2: 27 mmol/L (ref 20–32)
Calcium: 9.1 mg/dL (ref 8.6–10.2)
Creat: 0.81 mg/dL (ref 0.50–1.10)
GFR, Est African American: 102 mL/min/{1.73_m2} (ref >=60)
GFR, Est Non African American: 88 mL/min/{1.73_m2} (ref >=60)
GLOBULIN: 2.6 g/dL (ref 1.9–3.7)
Glucose, Bld: 69 mg/dL (ref 65–99)
POTASSIUM: 4.2 mmol/L (ref 3.5–5.3)
Sodium: 137 mmol/L (ref 135–146)
Total Protein: 6.7 g/dL (ref 6.1–8.1)

## 2018-02-16 LAB — ANTI-DNA ANTIBODY, DOUBLE-STRANDED: ds DNA Ab: 1 IU/mL

## 2018-02-16 LAB — SEDIMENTATION RATE: Sed Rate: 14 mm/h (ref 0–20)

## 2018-02-16 LAB — C3 AND C4
C3 Complement: 131 mg/dL (ref 83–193)
C4 Complement: 24 mg/dL (ref 15–57)

## 2018-02-20 ENCOUNTER — Other Ambulatory Visit: Payer: Self-pay | Admitting: Family

## 2018-02-20 DIAGNOSIS — Z862 Personal history of diseases of the blood and blood-forming organs and certain disorders involving the immune mechanism: Secondary | ICD-10-CM

## 2018-02-20 DIAGNOSIS — D500 Iron deficiency anemia secondary to blood loss (chronic): Secondary | ICD-10-CM

## 2018-02-21 ENCOUNTER — Other Ambulatory Visit: Payer: BLUE CROSS/BLUE SHIELD

## 2018-02-21 ENCOUNTER — Ambulatory Visit: Payer: BLUE CROSS/BLUE SHIELD | Admitting: Family

## 2018-03-21 ENCOUNTER — Other Ambulatory Visit: Payer: Self-pay

## 2018-03-21 DIAGNOSIS — Z79899 Other long term (current) drug therapy: Secondary | ICD-10-CM

## 2018-03-21 NOTE — Progress Notes (Signed)
Office Visit Note  Patient: Dominique Harvey             Date of Birth: [birth year]           MRN: [id number]             PCP: Charlies Silvers, PA-C Referring: Charlies Silvers, [place]* Visit Date: 03/24/2018 Occupation: @GUAROCC @  Subjective:  Arthritis (Right ankle pain)      History of Present Illness: Dominique Harvey is a 47 y.o. female with history of autoimmune disease and osteoarthritis.  She states about 2 weeks ago she started having pain and swelling in her right ankle joint.  She is having difficulty walking.  She is not going to the gym currently.  She is not having joint pain or swelling in any other joints.  Activities of Daily Living:  Patient reports morning stiffness for 20 minutes.   Patient Reports nocturnal pain.  Difficulty dressing/grooming: Denies Difficulty climbing stairs: Reports Difficulty getting out of chair: Denies Difficulty using hands for taps, buttons, cutlery, and/or writing: Reports  Review of Systems  Constitutional: Negative for fatigue, night sweats, weight gain and weight loss.  HENT: Negative for mouth sores, trouble swallowing, trouble swallowing, mouth dryness and nose dryness.   Eyes: Negative for pain, redness, visual disturbance and dryness.  Respiratory: Negative for cough, shortness of breath and difficulty breathing.   Cardiovascular: Positive for swelling in legs/feet. Negative for chest pain, palpitations, hypertension and irregular heartbeat.  Gastrointestinal: Negative for blood in stool, constipation and diarrhea.  Endocrine: Negative for increased urination.  Genitourinary: Negative for difficulty urinating and vaginal dryness.  Musculoskeletal: Positive for arthralgias, joint pain, joint swelling, muscle weakness and morning stiffness. Negative for myalgias, muscle tenderness and myalgias.  Skin: Negative for color change, rash, hair loss, skin tightness, ulcers and sensitivity to sunlight.  Allergic/Immunologic:  Negative for susceptible to infections.  Neurological: Positive for weakness. Negative for dizziness, memory loss and night sweats.  Hematological: Negative for bruising/bleeding tendency and swollen glands.  Psychiatric/Behavioral: Negative for depressed mood and sleep disturbance. The patient is not nervous/anxious.     PMFS History:  Patient Active Problem List   Diagnosis Date Noted  . Primary osteoarthritis of both knees 03/17/2017  . 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 Rheumatoid arthritis involving multiple sites with positive rheumatoid factor (HCC) 07/14/2016  . SLE-Sjogren overlap syndrome (HCC) 07/14/2016  . High risk medication use 07/14/2016  . History of anemia 07/14/2016  . History of hypertension 07/14/2016  . History of anxiety 07/14/2016  . History of nephrolithiasis 07/14/2016  . Thalassemia minor 07/14/2016  . History of herpes genitalis 07/14/2016  . Palpitations   . Sinusitis   . Joint pain   . Upper respiratory infection   . Anxiety     Past Medical History:  Diagnosis Date  . Anemia   . Anxiety   . Hypertension   . Joint pain   . Palpitations   . RA (rheumatoid arthritis) (HCC)   . Sinusitis   . Thalassemia minor   . Upper respiratory infection     Family History  Problem Relation Age of Onset  . Mitral valve prolapse Mother   . Atrial fibrillation Mother   . Rheum arthritis Mother   . Cancer Unknown        `  . Hypertension Unknown   . Leukemia Father   . Healthy Daughter   . Breast cancer Neg  Hx    Past Surgical History:  Procedure Laterality Date  . CESAREAN SECTION  2011  . KIDNEY SURGERY     age 67   . THERAPEUTIC ABORTION    . TONSILLECTOMY     age 30  . WISDOM TOOTH EXTRACTION     Social History   Social History Narrative  . Not on file    Objective: Vital Signs: BP 132/85 (BP Location: Left Arm, Patient Position: Sitting, Cuff Size:  Normal)   Pulse 89   Resp 16   Ht 5\' 8"  (1.727 m)   Wt 178 lb (80.7 kg)   LMP 03/05/2018   BMI 27.06 kg/m    Physical Exam  Constitutional: She is oriented to person, place, and time. She appears well-developed and well-nourished.  HENT:  Head: Normocephalic and atraumatic.  Eyes: Conjunctivae and EOM are normal.  Neck: Normal range of motion.  Cardiovascular: Normal rate, regular rhythm, normal heart sounds and intact distal pulses.  Pulmonary/Chest: Effort normal and breath sounds normal.  Abdominal: Soft. Bowel sounds are normal.  Lymphadenopathy:    She has no cervical adenopathy.  Neurological: She is alert and oriented to person, place, and time.  Skin: Skin is warm and dry. Capillary refill takes less than 2 seconds.  Psychiatric: She has a normal mood and affect. Her behavior is normal.  Nursing note and vitals reviewed.    Musculoskeletal Exam: On thoracic lumbar spine good range of motion.  Shoulder joints elbow joints wrist joint MCPs PIPs DIPs were in good range of motion.  No synovitis was noted.  Hip joints, knee joints were in good range of motion.  She has warmth and swelling over her right ankle joint.  CDAI Exam: CDAI Score: Not documented Patient Global Assessment: Not documented; Provider Global Assessment: Not documented Swollen: Not documented; Tender: Not documented Joint Exam   Not documented   There is currently no information documented on the homunculus. Go to the Rheumatology activity and complete the homunculus joint exam.  Investigation: No additional findings.  Imaging: Xr Ankle 2 Views Right  Result Date: 03/24/2018 No tibiotalar subtalar joint space narrowing was noted.  Soft tissue swelling was noted.  No fracture was noted.   Recent Labs: Lab Results  Component Value Date   WBC 4.3 03/21/2018   HGB 10.1 (L) 03/21/2018   PLT 355 03/21/2018   NA 136 03/21/2018   K 4.1 03/21/2018   CL 103 03/21/2018   CO2 27 03/21/2018    GLUCOSE 94 03/21/2018   BUN 15 03/21/2018   CREATININE 0.74 03/21/2018   BILITOT 0.4 03/21/2018   ALKPHOS 69 08/15/2017   AST 7 (L) 03/21/2018   ALT 7 03/21/2018   PROT 7.1 03/21/2018   ALBUMIN 4.0 08/15/2017   CALCIUM 9.4 03/21/2018   GFRAA 113 03/21/2018    Speciality Comments: No specialty comments available.  Procedures:  Medium Joint Inj: R ankle on 03/24/2018 10:26 AM Indications: pain and joint swelling Details: 27 G 1.5 in needle, ultrasound-guided anterior approach Medications: 1 mL lidocaine 1 %; 30 mg triamcinolone acetonide 40 MG/ML Aspirate: 0 mL Outcome: tolerated well, no immediate complications Procedure, treatment alternatives, risks and benefits explained, specific risks discussed. Consent was given by the patient. Immediately prior to procedure a time out was called to verify the correct patient, procedure, equipment, support staff and site/side marked as required. Patient was prepped and draped in the usual sterile fashion.     Allergies: Sulfa antibiotics   Assessment / Plan:  Visit Diagnoses: Autoimmune disease Positive ANA, positive Ro, positive RF positive 14-3-3 eta antibody and positive sicca-she continues to have some sicca symptoms.  She states her arthritis is been very well controlled on Imuran but now she has developed right ankle joint swelling.  There is no history of trauma.  X-ray was unremarkable except for soft tissue swelling.  High risk medication use - Current regimen includes Imuran 100 mg daily at bedtime.   Most recent CBC/CMP 03/21/18.  Next CBC/CMP due February and then every 3 months.  Standing order in place. Patient had flu vaccine in October and recommend Pneumovax 23.  Pain in right ankle and joints of right foot - Plan: XR Ankle 2 Views Right treat the x-ray of the ankle joint was unremarkable.  She has been having pain and swelling in her right ankle joint.  I plan to inject her ankle joint with cortisone.  Patient tolerated  procedure well.  Postinjection instructions were given.  I offered oral prednisone which she declined.  Pes planus bilateral-proper fitting shoes with arch support were discussed.  Primary osteoarthritis of both knees-currently she is not having much discomfort.  History of hypertension-her blood pressure is well controlled.  Beta thalassemia (HCC)  History of nephrolithiasis  History of thalassemia minor - Dr. Myna Hidalgo.   History of anemia - She recently had a miscarriage.  She had lab work performed on 02/06/2018.  Hemoglobin was 11.3  History of anxiety   Orders: Orders Placed This Encounter  Procedures  . XR Ankle 2 Views Right   No orders of the defined types were placed in this encounter.   Face-to-face time spent with patient was 30 minutes. Greater than 50% of time was spent in counseling and coordination of care.  Follow-Up Instructions: Return for Autoimmune disease, Osteoarthritis.   Pollyann Savoy, MD  Note - This record has been created using Animal nutritionist.  Chart creation errors have been sought, but may not always  have been located. Such creation errors do not reflect on  the standard of medical care.

## 2018-03-22 LAB — COMPLETE METABOLIC PANEL WITH GFR
AG Ratio: 1.7 (calc) (ref 1.0–2.5)
ALBUMIN MSPROF: 4.5 g/dL (ref 3.6–5.1)
ALT: 7 U/L (ref 6–29)
AST: 7 U/L — ABNORMAL LOW (ref 10–35)
Alkaline phosphatase (APISO): 50 U/L (ref 33–115)
BUN: 15 mg/dL (ref 7–25)
CALCIUM: 9.4 mg/dL (ref 8.6–10.2)
CO2: 27 mmol/L (ref 20–32)
CREATININE: 0.74 mg/dL (ref 0.50–1.10)
Chloride: 103 mmol/L (ref 98–110)
GFR, EST AFRICAN AMERICAN: 113 mL/min/{1.73_m2} (ref >=60)
GFR, EST NON AFRICAN AMERICAN: 98 mL/min/{1.73_m2} (ref >=60)
GLUCOSE: 94 mg/dL (ref 65–99)
Globulin: 2.6 g/dL (calc) (ref 1.9–3.7)
Potassium: 4.1 mmol/L (ref 3.5–5.3)
Sodium: 136 mmol/L (ref 135–146)
TOTAL PROTEIN: 7.1 g/dL (ref 6.1–8.1)
Total Bilirubin: 0.4 mg/dL (ref 0.2–1.2)

## 2018-03-22 LAB — CBC WITH DIFFERENTIAL/PLATELET
BASOS ABS: 0 {cells}/uL (ref 0–200)
Basophils Relative: 0 %
Eosinophils Absolute: 52 cells/uL (ref 15–500)
Eosinophils Relative: 1.2 %
HEMATOCRIT: 34 % — AB (ref 35.0–45.0)
HEMOGLOBIN: 10.1 g/dL — AB (ref 11.7–15.5)
Lymphs Abs: 482 cells/uL — ABNORMAL LOW (ref 850–3900)
MCH: 20.3 pg — ABNORMAL LOW (ref 27.0–33.0)
MCHC: 29.7 g/dL — AB (ref 32.0–36.0)
MCV: 68.4 fL — AB (ref 80.0–100.0)
MPV: 12.8 fL — ABNORMAL HIGH (ref 7.5–12.5)
Monocytes Relative: 10 %
NEUTROS PCT: 77.6 %
Neutro Abs: 3337 cells/uL (ref 1500–7800)
Platelets: 355 10*3/uL (ref 140–400)
RBC: 4.97 10*6/uL (ref 3.80–5.10)
RDW: 16.3 % — AB (ref 11.0–15.0)
Total Lymphocyte: 11.2 %
WBC: 4.3 10*3/uL (ref 3.8–10.8)
WBCMIX: 430 {cells}/uL (ref 200–950)

## 2018-03-22 LAB — CBC MORPHOLOGY

## 2018-03-22 NOTE — Progress Notes (Signed)
Labs show persistent anemia.  Please advise patient to follow-up with her PCP and fax results to her PCP.

## 2018-03-24 ENCOUNTER — Encounter (INDEPENDENT_AMBULATORY_CARE_PROVIDER_SITE_OTHER): Payer: Self-pay

## 2018-03-24 ENCOUNTER — Encounter: Payer: Self-pay | Admitting: Rheumatology

## 2018-03-24 ENCOUNTER — Ambulatory Visit (INDEPENDENT_AMBULATORY_CARE_PROVIDER_SITE_OTHER): Payer: BLUE CROSS/BLUE SHIELD | Admitting: Rheumatology

## 2018-03-24 ENCOUNTER — Ambulatory Visit (INDEPENDENT_AMBULATORY_CARE_PROVIDER_SITE_OTHER): Payer: Self-pay

## 2018-03-24 VITALS — BP 132/85 | HR 89 | Resp 16 | Ht 68.0 in | Wt 178.0 lb

## 2018-03-24 DIAGNOSIS — Z87442 Personal history of urinary calculi: Secondary | ICD-10-CM

## 2018-03-24 DIAGNOSIS — M2142 Flat foot [pes planus] (acquired), left foot: Secondary | ICD-10-CM

## 2018-03-24 DIAGNOSIS — M25571 Pain in right ankle and joints of right foot: Secondary | ICD-10-CM

## 2018-03-24 DIAGNOSIS — M2141 Flat foot [pes planus] (acquired), right foot: Secondary | ICD-10-CM

## 2018-03-24 DIAGNOSIS — D561 Beta thalassemia: Secondary | ICD-10-CM

## 2018-03-24 DIAGNOSIS — Z8659 Personal history of other mental and behavioral disorders: Secondary | ICD-10-CM

## 2018-03-24 DIAGNOSIS — Z79899 Other long term (current) drug therapy: Secondary | ICD-10-CM | POA: Diagnosis not present

## 2018-03-24 DIAGNOSIS — M170 Bilateral primary osteoarthritis of knee: Secondary | ICD-10-CM

## 2018-03-24 DIAGNOSIS — Z8679 Personal history of other diseases of the circulatory system: Secondary | ICD-10-CM

## 2018-03-24 DIAGNOSIS — Z862 Personal history of diseases of the blood and blood-forming organs and certain disorders involving the immune mechanism: Secondary | ICD-10-CM

## 2018-03-24 DIAGNOSIS — M359 Systemic involvement of connective tissue, unspecified: Secondary | ICD-10-CM

## 2018-03-24 MED ORDER — LIDOCAINE HCL 1 % IJ SOLN
1.0000 mL | INTRAMUSCULAR | Status: AC | PRN
  Administered 2018-03-24: 1 mL

## 2018-03-24 MED ORDER — TRIAMCINOLONE ACETONIDE 40 MG/ML IJ SUSP
30.0000 mg | INTRAMUSCULAR | Status: AC | PRN
  Administered 2018-03-24: 30 mg via INTRA_ARTICULAR

## 2018-03-24 NOTE — Patient Instructions (Addendum)
Standing Labs We placed an order today for your standing lab work.    Please come back and get your standing labs in February and every 3 months  We have open lab Monday through Friday from 8:30-11:30 AM and 1:30-4:00 PM  at the office of Dr. Pollyann Savoy.   You may experience shorter wait times on Monday and Friday afternoons. The office is located at [address], [address], [place], [place] [address] No appointment is necessary.   Labs are drawn by [facility].  You may receive a bill from [facility] for your lab work. If you have any questions regarding directions or hours of operation,  please call [phone].   Just as a reminder please drink plenty of water prior to coming for your lab work. Thanks!   Recommend Pneumovax 23

## 2018-04-07 ENCOUNTER — Other Ambulatory Visit: Payer: Self-pay | Admitting: Physician Assistant

## 2018-04-07 DIAGNOSIS — Z1231 Encounter for screening mammogram for malignant neoplasm of breast: Secondary | ICD-10-CM

## 2018-05-12 ENCOUNTER — Ambulatory Visit
Admission: RE | Admit: 2018-05-12 | Discharge: 2018-05-12 | Disposition: A | Discharge status: Home / Self Care | Payer: BLUE CROSS/BLUE SHIELD | Source: Ambulatory Visit | Attending: Physician Assistant | Admitting: Physician Assistant

## 2018-05-12 DIAGNOSIS — Z1231 Encounter for screening mammogram for malignant neoplasm of breast: Secondary | ICD-10-CM

## 2018-05-31 ENCOUNTER — Other Ambulatory Visit: Payer: Self-pay | Admitting: Physician Assistant

## 2018-05-31 NOTE — Telephone Encounter (Signed)
Last Visit: 03/24/18 Next Visit: 07/19/18 Labs: 03/21/18 persistent anemia  Okay to refill per Dr. Corliss Skainseveshwar

## 2018-07-05 NOTE — Progress Notes (Deleted)
Office Visit Note  Patient: Dominique Harvey             Date of Birth: 31-Aug-1970           MRN: [id number]             PCP: Charlies Silvers, PA-C Referring: Charlies Silvers, [place]* Visit Date: 07/19/2018 Occupation: @GUAROCC @  Subjective:  No chief complaint on file.  Imuran 50 mg 2 tablets daily.  Most recent CBC/CMP within normal limits except for persistent anemia on 03/21/2018.  Labs forwarded to PCP.  Due for CBC/CMP today and will monitor every 3 months.  Standing orders are in place.  She received her flu shot in October.  Recommend Prevnar 13 and Pneumovax 23 as indicated.  History of Present Illness: Dominique Harvey is a 48 y.o. female ***   Activities of Daily Living:  Patient reports morning stiffness for *** {minute/hour:19697}.   Patient {ACTIONS;DENIES/REPORTS:21021675::"Denies"} nocturnal pain.  Difficulty dressing/grooming: {ACTIONS;DENIES/REPORTS:21021675::"Denies"} Difficulty climbing stairs: {ACTIONS;DENIES/REPORTS:21021675::"Denies"} Difficulty getting out of chair: {ACTIONS;DENIES/REPORTS:21021675::"Denies"} Difficulty using hands for taps, buttons, cutlery, and/or writing: {ACTIONS;DENIES/REPORTS:21021675::"Denies"}  No Rheumatology ROS completed.   PMFS History:  Patient Active Problem List   Diagnosis Date Noted  . Primary osteoarthritis of both knees 03/17/2017  . 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 Rheumatoid arthritis involving multiple sites with positive rheumatoid factor (HCC) 07/14/2016  . SLE-Sjogren overlap syndrome (HCC) 07/14/2016  . High risk medication use 07/14/2016  . History of anemia 07/14/2016  . History of hypertension 07/14/2016  . History of anxiety 07/14/2016  . History of nephrolithiasis 07/14/2016  . Thalassemia minor 07/14/2016  . History of herpes genitalis 07/14/2016  . Palpitations   . Sinusitis   . Joint pain   . Upper  respiratory infection   . Anxiety     Past Medical History:  Diagnosis Date  . Anemia   . Anxiety   . Hypertension   . Joint pain   . Palpitations   . RA (rheumatoid arthritis) (HCC)   . Sinusitis   . Thalassemia minor   . Upper respiratory infection     Family History  Problem Relation Age of Onset  . Mitral valve prolapse Mother   . Atrial fibrillation Mother   . Rheum arthritis Mother   . Cancer Other        `  . Hypertension Other   . Leukemia Father   . Healthy Daughter   . Breast cancer Neg Hx    Past Surgical History:  Procedure Laterality Date  . CESAREAN SECTION  2011  . KIDNEY SURGERY     age 17   . THERAPEUTIC ABORTION    . TONSILLECTOMY     age 31  . WISDOM TOOTH EXTRACTION     Social History   Social History Narrative  . Not on file   Immunization History  Administered Date(s) Administered  . Influenza Inj Mdck Quad Pf 02/08/2018  . Influenza,inj,quad, With Preservative 01/11/2016  . Influenza-Unspecified 01/30/2015, 01/11/2016  . Td 10/05/2002  . Tdap 01/30/2014     Objective: Vital Signs: There were no vitals taken for this visit.   Physical Exam   Musculoskeletal Exam: ***  CDAI Exam: CDAI Score: Not documented Patient Global Assessment: Not documented; Provider Global Assessment: Not documented Swollen: Not documented; Tender: Not documented Joint Exam   Not documented   There is currently no information documented on the homunculus. Go to the Rheumatology  activity and complete the homunculus joint exam.  Investigation: No additional findings.  Imaging: No results found.  Recent Labs: Lab Results  Component Value Date   WBC 4.3 03/21/2018   HGB 10.1 (L) 03/21/2018   PLT 355 03/21/2018   NA 136 03/21/2018   K 4.1 03/21/2018   CL 103 03/21/2018   CO2 27 03/21/2018   GLUCOSE 94 03/21/2018   BUN 15 03/21/2018   CREATININE 0.74 03/21/2018   BILITOT 0.4 03/21/2018   ALKPHOS 69 08/15/2017   AST 7 (L) 03/21/2018   ALT  7 03/21/2018   PROT 7.1 03/21/2018   ALBUMIN 4.0 08/15/2017   CALCIUM 9.4 03/21/2018   GFRAA 113 03/21/2018    Speciality Comments: No specialty comments available.  Procedures:  No procedures performed Allergies: Sulfa antibiotics   Assessment / Plan:     Visit Diagnoses: Autoimmune disease Positive ANA, positive Ro, positive RF positive 14-3-3 eta antibody and positive sicca  High risk medication use -  Imuran 100 mg daily   Primary osteoarthritis of both knees  Beta thalassemia (HCC)  Lateral epicondylitis of both elbows  Pes planus of both feet  History of hypertension  History of nephrolithiasis  History of anemia  History of anxiety   Orders: No orders of the defined types were placed in this encounter.  No orders of the defined types were placed in this encounter.   Face-to-face time spent with patient was *** minutes. Greater than 50% of time was spent in counseling and coordination of care.  Follow-Up Instructions: No follow-ups on file.   Gearldine Bienenstock, PA-C  Note - This record has been created using Dragon software.  Chart creation errors have been sought, but may not always  have been located. Such creation errors do not reflect on  the standard of medical care.

## 2018-07-18 ENCOUNTER — Other Ambulatory Visit: Payer: Self-pay

## 2018-07-18 DIAGNOSIS — Z79899 Other long term (current) drug therapy: Secondary | ICD-10-CM

## 2018-07-18 LAB — CBC WITH DIFFERENTIAL/PLATELET
ABSOLUTE MONOCYTES: 419 {cells}/uL (ref 200–950)
BASOS PCT: 0.2 %
Basophils Absolute: 9 cells/uL (ref 0–200)
EOS ABS: 51 {cells}/uL (ref 15–500)
Eosinophils Relative: 1.1 %
HEMATOCRIT: 34.9 % — AB (ref 35.0–45.0)
Hemoglobin: 10.8 g/dL — ABNORMAL LOW (ref 11.7–15.5)
LYMPHS ABS: 437 {cells}/uL — AB (ref 850–3900)
MCH: 20.7 pg — ABNORMAL LOW (ref 27.0–33.0)
MCHC: 30.9 g/dL — ABNORMAL LOW (ref 32.0–36.0)
MCV: 66.9 fL — AB (ref 80.0–100.0)
MPV: 11.8 fL (ref 7.5–12.5)
Monocytes Relative: 9.1 %
NEUTROS ABS: 3685 {cells}/uL (ref 1500–7800)
Neutrophils Relative %: 80.1 %
Platelets: 360 10*3/uL (ref 140–400)
RBC: 5.22 10*6/uL — ABNORMAL HIGH (ref 3.80–5.10)
RDW: 16.5 % — AB (ref 11.0–15.0)
Total Lymphocyte: 9.5 %
WBC: 4.6 10*3/uL (ref 3.8–10.8)

## 2018-07-18 LAB — COMPLETE METABOLIC PANEL WITH GFR
AG RATIO: 1.6 (calc) (ref 1.0–2.5)
ALKALINE PHOSPHATASE (APISO): 48 U/L (ref 31–125)
ALT: 9 U/L (ref 6–29)
AST: 15 U/L (ref 10–35)
Albumin: 4.2 g/dL (ref 3.6–5.1)
BUN: 13 mg/dL (ref 7–25)
CO2: 24 mmol/L (ref 20–32)
CREATININE: 0.76 mg/dL (ref 0.50–1.10)
Calcium: 9.3 mg/dL (ref 8.6–10.2)
Chloride: 105 mmol/L (ref 98–110)
GFR, EST NON AFRICAN AMERICAN: 95 mL/min/{1.73_m2} (ref >=60)
GFR, Est African American: 110 mL/min/{1.73_m2} (ref >=60)
GLOBULIN: 2.7 g/dL (ref 1.9–3.7)
Glucose, Bld: 86 mg/dL (ref 65–99)
Potassium: 4.1 mmol/L (ref 3.5–5.3)
Sodium: 138 mmol/L (ref 135–146)
Total Bilirubin: 0.4 mg/dL (ref 0.2–1.2)
Total Protein: 6.9 g/dL (ref 6.1–8.1)

## 2018-07-18 LAB — CBC MORPHOLOGY

## 2018-07-19 ENCOUNTER — Ambulatory Visit: Payer: BLUE CROSS/BLUE SHIELD | Admitting: Physician Assistant

## 2018-07-19 NOTE — Progress Notes (Signed)
Anemia persists.  Please notify patient.  Please forward labs to her PCP.  Advised patient to contact her PCP regarding anemia.

## 2018-07-25 NOTE — Progress Notes (Signed)
Office Visit Note  Patient: Dominique Harvey             Date of Birth: April 02, 1971           MRN: [id number]             PCP: Charlies Silversouillard, Kiaria, PA-C Referring: Charlies Silversouillard, Alyzabeth, [place]* Visit Date: 07/26/2018 Occupation: @GUAROCC @  Subjective:  Knee pain.   History of Present Illness: Dominique BrookeJennifer E Clapper is a 48 y.o. female with history of autoimmune disease and osteoarthritis.  She states she is doing fairly well.  She had gone for hiking recently and had some knee joint discomfort after that.  But no joint swelling.  Her ankle and foot is doing better after she changed her shoes.  She denies any sicca symptoms.  Activities of Daily Living:  Patient reports morning stiffness for 5 minutes.   Patient Reports nocturnal pain.  Difficulty dressing/grooming: Denies Difficulty climbing stairs: Reports Difficulty getting out of chair: Denies Difficulty using hands for taps, buttons, cutlery, and/or writing: Reports  Review of Systems  Constitutional: Negative for fatigue.  HENT: Negative for mouth sores, mouth dryness and nose dryness.   Eyes: Negative for pain, itching and dryness.  Respiratory: Negative for shortness of breath, wheezing and difficulty breathing.   Cardiovascular: Negative for chest pain, palpitations and swelling in legs/feet.  Gastrointestinal: Negative for abdominal pain, blood in stool, constipation and diarrhea.  Endocrine: Negative for increased urination.  Genitourinary: Negative for painful urination and pelvic pain.  Musculoskeletal: Positive for arthralgias, joint pain and morning stiffness. Negative for joint swelling.  Skin: Negative for rash, hair loss and redness.  Allergic/Immunologic: Negative for susceptible to infections.  Neurological: Negative for dizziness, light-headedness, numbness, headaches, memory loss and weakness.  Hematological: Negative for bruising/bleeding tendency.  Psychiatric/Behavioral: Negative for confusion and sleep  disturbance. The patient is not nervous/anxious.     PMFS History:  Patient Active Problem List   Diagnosis Date Noted  . Primary osteoarthritis of both knees 03/17/2017  . 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 Rheumatoid arthritis involving multiple sites with positive rheumatoid factor (HCC) 07/14/2016  . SLE-Sjogren overlap syndrome (HCC) 07/14/2016  . High risk medication use 07/14/2016  . History of anemia 07/14/2016  . History of hypertension 07/14/2016  . History of anxiety 07/14/2016  . History of nephrolithiasis 07/14/2016  . Thalassemia minor 07/14/2016  . History of herpes genitalis 07/14/2016  . Palpitations   . Sinusitis   . Joint pain   . Upper respiratory infection   . Anxiety     Past Medical History:  Diagnosis Date  . Anemia   . Anxiety   . Hypertension   . Joint pain   . Palpitations   . RA (rheumatoid arthritis) (HCC)   . Sinusitis   . Thalassemia minor   . Upper respiratory infection     Family History  Problem Relation Age of Onset  . Mitral valve prolapse Mother   . Atrial fibrillation Mother   . Rheum arthritis Mother   . Cancer Other        `  . Hypertension Other   . Leukemia Father   . Healthy Daughter   . Breast cancer Neg Hx    Past Surgical History:  Procedure Laterality Date  . CESAREAN SECTION  2011  . KIDNEY SURGERY     age 48   . THERAPEUTIC ABORTION    . TONSILLECTOMY  age 62  . WISDOM TOOTH EXTRACTION     Social History   Social History Narrative  . Not on file   Immunization History  Administered Date(s) Administered  . Influenza Inj Mdck Quad Pf 02/08/2018  . Influenza,inj,Quad PF,6+ Mos 01/30/2015  . Influenza,inj,quad, With Preservative 01/11/2016  . Influenza-Unspecified 01/30/2015, 01/11/2016  . Td 10/05/2002  . Tdap 01/30/2014     Objective: Vital Signs: BP (!) 140/104 (BP Location: Left Arm, Patient Position: Sitting,  Cuff Size: Normal)   Pulse 95   Resp 12   Ht 5\' 8"  (1.727 m)   Wt 182 lb (82.6 kg)   BMI 27.67 kg/m    Physical Exam Vitals signs and nursing note reviewed.  Constitutional:      Appearance: She is well-developed.  HENT:     Head: Normocephalic and atraumatic.  Eyes:     Conjunctiva/sclera: Conjunctivae normal.  Neck:     Musculoskeletal: Normal range of motion.  Cardiovascular:     Rate and Rhythm: Normal rate and regular rhythm.     Heart sounds: Normal heart sounds.  Pulmonary:     Effort: Pulmonary effort is normal.     Breath sounds: Normal breath sounds.  Abdominal:     General: Bowel sounds are normal.     Palpations: Abdomen is soft.  Lymphadenopathy:     Cervical: No cervical adenopathy.  Skin:    General: Skin is warm and dry.     Capillary Refill: Capillary refill takes less than 2 seconds.  Neurological:     Mental Status: She is alert and oriented to person, place, and time.  Psychiatric:        Behavior: Behavior normal.      Musculoskeletal Exam: C-spine thoracic and lumbar spine good range of motion.  Shoulder joints, elbow joints, wrist joints, MCPs PIPs and DIPs with good range of motion with no synovitis.  Hip joints, knee joints, ankles, MTPs PIPs and DIPs been good range of motion with no synovitis.  CDAI Exam: CDAI Score: Not documented Patient Global Assessment: Not documented; Provider Global Assessment: Not documented Swollen: Not documented; Tender: Not documented Joint Exam   Not documented   There is currently no information documented on the homunculus. Go to the Rheumatology activity and complete the homunculus joint exam.  Investigation: No additional findings.  Imaging: No results found.  Recent Labs: Lab Results  Component Value Date   WBC 4.6 07/18/2018   HGB 10.8 (L) 07/18/2018   PLT 360 07/18/2018   NA 138 07/18/2018   K 4.1 07/18/2018   CL 105 07/18/2018   CO2 24 07/18/2018   GLUCOSE 86 07/18/2018   BUN 13  07/18/2018   CREATININE 0.76 07/18/2018   BILITOT 0.4 07/18/2018   ALKPHOS 69 08/15/2017   AST 15 07/18/2018   ALT 9 07/18/2018   PROT 6.9 07/18/2018   ALBUMIN 4.0 08/15/2017   CALCIUM 9.3 07/18/2018   GFRAA 110 07/18/2018    Speciality Comments: No specialty comments available.  Procedures:  No procedures performed Allergies: Sulfa antibiotics   Assessment / Plan:     Visit Diagnoses: Autoimmune disease Positive ANA, positive Ro, positive RF positive 14-3-3 eta antibody and positive sicca-patient is doing well on Imuran.  She denies any sicca symptoms.  She denies any joint pain or joint swelling.  She had some discomfort in her knee joints after hiking which is getting better.  High risk medication use - Imuran 100 mg daily.  Her labs have been stable.  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  Pain in right ankle and joints of right foot-her discomfort is improved.  Primary osteoarthritis of both knees-chronic mild discomfort  History of anxiety  History of thalassemia minor - Dr. Myna Hidalgo.  Patient states on folic acid she has been stable.  History of hypertension-her blood pressure was elevated today.  History of nephrolithiasis  Beta thalassemia (HCC)  History of anemia   Orders: No orders of the defined types were placed in this encounter.  No orders of the defined types were placed in this encounter.   Face-to-face time spent with patient was 30 minutes. Greater than 50% of time was spent in counseling and coordination of care.  Follow-Up Instructions: Return in about 5 months (around 12/26/2018) for Autoimmune disease, Osteoarthritis.   Pollyann Savoy, MD  Note - This record has been created using Animal nutritionist.  Chart creation errors have been sought, but may not always  have been located. Such creation errors do not reflect on  the standard of medical care.

## 2018-07-26 ENCOUNTER — Encounter: Payer: Self-pay | Admitting: Physician Assistant

## 2018-07-26 ENCOUNTER — Ambulatory Visit (INDEPENDENT_AMBULATORY_CARE_PROVIDER_SITE_OTHER): Payer: BLUE CROSS/BLUE SHIELD | Admitting: Rheumatology

## 2018-07-26 ENCOUNTER — Other Ambulatory Visit: Payer: Self-pay

## 2018-07-26 VITALS — BP 140/104 | HR 95 | Resp 12 | Ht 68.0 in | Wt 182.0 lb

## 2018-07-26 DIAGNOSIS — M359 Systemic involvement of connective tissue, unspecified: Secondary | ICD-10-CM | POA: Diagnosis not present

## 2018-07-26 DIAGNOSIS — Z87442 Personal history of urinary calculi: Secondary | ICD-10-CM

## 2018-07-26 DIAGNOSIS — M170 Bilateral primary osteoarthritis of knee: Secondary | ICD-10-CM

## 2018-07-26 DIAGNOSIS — D561 Beta thalassemia: Secondary | ICD-10-CM

## 2018-07-26 DIAGNOSIS — Z862 Personal history of diseases of the blood and blood-forming organs and certain disorders involving the immune mechanism: Secondary | ICD-10-CM

## 2018-07-26 DIAGNOSIS — Z79899 Other long term (current) drug therapy: Secondary | ICD-10-CM

## 2018-07-26 DIAGNOSIS — Z8659 Personal history of other mental and behavioral disorders: Secondary | ICD-10-CM

## 2018-07-26 DIAGNOSIS — Z8679 Personal history of other diseases of the circulatory system: Secondary | ICD-10-CM

## 2018-07-26 NOTE — Patient Instructions (Addendum)
Standing Labs We placed an order today for your standing lab work.    Please come back and get your standing labs in June and every 3 months   We have open lab Monday through Friday from 8:30-11:30 AM and 1:30-4:00 PM  at the office of Dr. Laurena Valko.   You may experience shorter wait times on Monday and Friday afternoons. The office is located at [address], [address], [place], [place] [address] No appointment is necessary.   Labs are drawn by [facility].  You may receive a bill from [facility] for your lab work.  If you wish to have your labs drawn at another location, please call the office 24 hours in advance to send orders.  If you have any questions regarding directions or hours of operation,  please call [phone].   Just as a reminder please drink plenty of water prior to coming for your lab work. Thanks!   

## 2018-08-23 ENCOUNTER — Other Ambulatory Visit: Payer: Self-pay | Admitting: Physician Assistant

## 2018-08-23 NOTE — Telephone Encounter (Signed)
Last Visit: 07/26/2018 Next Visit: 12/26/2018 Labs: 07/18/2018 Anemia persists.   Okay to refill per Dr.Deveshwar.

## 2018-11-23 ENCOUNTER — Other Ambulatory Visit: Payer: Self-pay | Admitting: Pharmacist

## 2018-11-23 DIAGNOSIS — Z79899 Other long term (current) drug therapy: Secondary | ICD-10-CM

## 2018-11-23 NOTE — Progress Notes (Signed)
Patient presents to clinic for labs.  She is on Imuran and last CBC/CMP in March. Order and future order placed for CBC/CMP.

## 2018-11-24 LAB — CBC WITH DIFFERENTIAL/PLATELET
Absolute Monocytes: 644 cells/uL (ref 200–950)
Basophils Absolute: 8 cells/uL (ref 0–200)
Basophils Relative: 0.2 %
Eosinophils Absolute: 68 cells/uL (ref 15–500)
Eosinophils Relative: 1.7 %
HCT: 35.3 % (ref 35.0–45.0)
Hemoglobin: 10.9 g/dL — ABNORMAL LOW (ref 11.7–15.5)
Lymphs Abs: 428 cells/uL — ABNORMAL LOW (ref 850–3900)
MCH: 20.8 pg — ABNORMAL LOW (ref 27.0–33.0)
MCHC: 30.9 g/dL — ABNORMAL LOW (ref 32.0–36.0)
MCV: 67.2 fL — ABNORMAL LOW (ref 80.0–100.0)
Monocytes Relative: 16.1 %
Neutro Abs: 2852 cells/uL (ref 1500–7800)
Neutrophils Relative %: 71.3 %
Platelets: 336 10*3/uL (ref 140–400)
RBC: 5.25 10*6/uL — ABNORMAL HIGH (ref 3.80–5.10)
RDW: 16.7 % — ABNORMAL HIGH (ref 11.0–15.0)
Total Lymphocyte: 10.7 %
WBC: 4 10*3/uL (ref 3.8–10.8)

## 2018-11-24 LAB — COMPLETE METABOLIC PANEL WITH GFR
AG Ratio: 1.6 (calc) (ref 1.0–2.5)
ALT: 10 U/L (ref 6–29)
AST: 14 U/L (ref 10–35)
Albumin: 4.3 g/dL (ref 3.6–5.1)
Alkaline phosphatase (APISO): 58 U/L (ref 31–125)
BUN: 15 mg/dL (ref 7–25)
CO2: 24 mmol/L (ref 20–32)
Calcium: 9.3 mg/dL (ref 8.6–10.2)
Chloride: 104 mmol/L (ref 98–110)
Creat: 0.82 mg/dL (ref 0.50–1.10)
GFR, Est African American: 99 mL/min/{1.73_m2} (ref >=60)
GFR, Est Non African American: 86 mL/min/{1.73_m2} (ref >=60)
Globulin: 2.7 g/dL (calc) (ref 1.9–3.7)
Glucose, Bld: 88 mg/dL (ref 65–99)
Potassium: 4.3 mmol/L (ref 3.5–5.3)
Sodium: 138 mmol/L (ref 135–146)
Total Bilirubin: 0.3 mg/dL (ref 0.2–1.2)
Total Protein: 7 g/dL (ref 6.1–8.1)

## 2018-11-24 NOTE — Progress Notes (Signed)
Anemia is stable.  We will continue to monitor.  CMP WNL

## 2018-12-06 ENCOUNTER — Telehealth: Payer: Self-pay | Admitting: Rheumatology

## 2018-12-06 ENCOUNTER — Other Ambulatory Visit: Payer: Self-pay | Admitting: Rheumatology

## 2018-12-06 MED ORDER — AZATHIOPRINE 50 MG PO TABS: 100.0000 mg | ORAL_TABLET | Freq: Every day | ORAL | 0 refills | Status: DC

## 2018-12-06 NOTE — Telephone Encounter (Signed)
Patient called stating her new insurance [employer] requires her to use [facility] for her prescription of Imuran.  Patient is requesting a refill.

## 2018-12-06 NOTE — Telephone Encounter (Signed)
Last Visit:07/26/2018 Next Visit:12/26/2018 Labs: 11/23/18 Anemia is stable. We will continue to monitor. CMP WNL   Okay to refill per Dr. Estanislado Pandy   [facility]

## 2018-12-06 NOTE — Telephone Encounter (Signed)
Last Visit: 07/26/2018 Next Visit: 12/26/2018 Labs: 11/23/18 Anemia is stable. We will continue to monitor. CMP WNL   Okay to refill per Dr. Estanislado Pandy

## 2018-12-12 NOTE — Progress Notes (Deleted)
Office Visit Note  Patient: Dominique Harvey             Date of Birth: 1970/07/04           MRN: [id number]             PCP: Dewayne Shorter, PA-C Referring: Dewayne Shorter, [place]* Visit Date: 12/26/2018 Occupation: @GUAROCC @  Subjective:  No chief complaint on file.   History of Present Illness: Dominique Harvey is a 48 y.o. female ***   Activities of Daily Living:  Patient reports morning stiffness for *** {minute/hour:19697}.   Patient {ACTIONS;DENIES/REPORTS:21021675::"Denies"} nocturnal pain.  Difficulty dressing/grooming: {ACTIONS;DENIES/REPORTS:21021675::"Denies"} Difficulty climbing stairs: {ACTIONS;DENIES/REPORTS:21021675::"Denies"} Difficulty getting out of chair: {ACTIONS;DENIES/REPORTS:21021675::"Denies"} Difficulty using hands for taps, buttons, cutlery, and/or writing: {ACTIONS;DENIES/REPORTS:21021675::"Denies"}  No Rheumatology ROS completed.   PMFS History:  Patient Active Problem List   Diagnosis Date Noted  . Primary osteoarthritis of both knees 03/17/2017  . 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 Rheumatoid arthritis involving multiple sites with positive rheumatoid factor ([place]) 07/14/2016  . SLE-Sjogren overlap syndrome ([place]) 07/14/2016  . High risk medication use 07/14/2016  . History of anemia 07/14/2016  . History of hypertension 07/14/2016  . History of anxiety 07/14/2016  . History of nephrolithiasis 07/14/2016  . Thalassemia minor 07/14/2016  . History of herpes genitalis 07/14/2016  . Palpitations   . Sinusitis   . Joint pain   . Upper respiratory infection   . Anxiety     Past Medical History:  Diagnosis Date  . Anemia   . Anxiety   . Hypertension   . Joint pain   . Palpitations   . RA (rheumatoid arthritis) ([place])   . Sinusitis   . Thalassemia minor   . Upper respiratory infection     Family History  Problem Relation Age of Onset  .  Mitral valve prolapse Mother   . Atrial fibrillation Mother   . Rheum arthritis Mother   . Cancer Other        `  . Hypertension Other   . Leukemia Father   . Healthy Daughter   . Breast cancer Neg Hx    Past Surgical History:  Procedure Laterality Date  . CESAREAN SECTION  2011  . KIDNEY SURGERY     age 42   . THERAPEUTIC ABORTION    . TONSILLECTOMY     age 65  . WISDOM TOOTH EXTRACTION     Social History   Social History Narrative  . Not on file   Immunization History  Administered Date(s) Administered  . Influenza Inj Mdck Quad Pf 02/08/2018  . Influenza,inj,Quad PF,6+ Mos 01/30/2015  . Influenza,inj,quad, With Preservative 01/11/2016  . Influenza-Unspecified 01/30/2015, 01/11/2016  . Td 10/05/2002  . Tdap 01/30/2014     Objective: Vital Signs: There were no vitals taken for this visit.   Physical Exam   Musculoskeletal Exam: ***  CDAI Exam: CDAI Score: - Patient Global: -; Provider Global: - Swollen: -; Tender: - Joint Exam   No joint exam has been documented for this visit   There is currently no information documented on the homunculus. Go to the Rheumatology activity and complete the homunculus joint exam.  Investigation: No additional findings.  Imaging: No results found.  Recent Labs: Lab Results  Component Value Date   WBC 4.0 11/23/2018   HGB 10.9 (L) 11/23/2018   PLT 336 11/23/2018   NA 138 11/23/2018   K 4.3 11/23/2018  CL 104 11/23/2018   CO2 24 11/23/2018   GLUCOSE 88 11/23/2018   BUN 15 11/23/2018   CREATININE 0.82 11/23/2018   BILITOT 0.3 11/23/2018   ALKPHOS 69 08/15/2017   AST 14 11/23/2018   ALT 10 11/23/2018   PROT 7.0 11/23/2018   ALBUMIN 4.0 08/15/2017   CALCIUM 9.3 11/23/2018   GFRAA 99 11/23/2018    Speciality Comments: No specialty comments available.  Procedures:  No procedures performed Allergies: Sulfa antibiotics   Assessment / Plan:     Visit Diagnoses: No diagnosis found.  Orders: No orders of  the defined types were placed in this encounter.  No orders of the defined types were placed in this encounter.   Face-to-face time spent with patient was *** minutes. Greater than 50% of time was spent in counseling and coordination of care.  Follow-Up Instructions: No follow-ups on file.   Gearldine Bienenstock, PA-C  Note - This record has been created using Dragon software.  Chart creation errors have been sought, but may not always  have been located. Such creation errors do not reflect on  the standard of medical care.

## 2018-12-15 ENCOUNTER — Telehealth: Payer: Self-pay | Admitting: Rheumatology

## 2018-12-15 MED ORDER — AZATHIOPRINE 50 MG PO TABS: 100.0000 mg | ORAL_TABLET | Freq: Every day | ORAL | 0 refills | Status: DC

## 2018-12-15 NOTE — Telephone Encounter (Signed)
Per patient rx for Imuran needs to sent to [facility] not [facility]. Please resend.

## 2018-12-15 NOTE — Telephone Encounter (Signed)
Spoke with patient who checked with the insurance and verified that the prescription has to be sent to [place]. Resent prescription.

## 2018-12-26 ENCOUNTER — Ambulatory Visit: Payer: Self-pay | Admitting: Physician Assistant

## 2018-12-26 NOTE — Progress Notes (Deleted)
Office Visit Note  Patient: Dominique Harvey             Date of Birth: [birth year]           MRN: [id number]             PCP: Dewayne Shorter, PA-C Referring: Dewayne Shorter, [place]* Visit Date: 01/02/2019 Occupation: @GUAROCC @  Subjective:  No chief complaint on file.  She can discontinue folic acid as she is no longer on MTX.  History of Present Illness: Dominique Harvey is a 48 y.o. female ***   Activities of Daily Living:  Patient reports morning stiffness for *** {minute/hour:19697}.   Patient {ACTIONS;DENIES/REPORTS:21021675::"Denies"} nocturnal pain.  Difficulty dressing/grooming: {ACTIONS;DENIES/REPORTS:21021675::"Denies"} Difficulty climbing stairs: {ACTIONS;DENIES/REPORTS:21021675::"Denies"} Difficulty getting out of chair: {ACTIONS;DENIES/REPORTS:21021675::"Denies"} Difficulty using hands for taps, buttons, cutlery, and/or writing: {ACTIONS;DENIES/REPORTS:21021675::"Denies"}  No Rheumatology ROS completed.   PMFS History:  Patient Active Problem List   Diagnosis Date Noted  . Primary osteoarthritis of both knees 03/17/2017  . 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 Rheumatoid arthritis involving multiple sites with positive rheumatoid factor ([place]) 07/14/2016  . SLE-Sjogren overlap syndrome ([place]) 07/14/2016  . High risk medication use 07/14/2016  . History of anemia 07/14/2016  . History of hypertension 07/14/2016  . History of anxiety 07/14/2016  . History of nephrolithiasis 07/14/2016  . Thalassemia minor 07/14/2016  . History of herpes genitalis 07/14/2016  . Palpitations   . Sinusitis   . Joint pain   . Upper respiratory infection   . Anxiety     Past Medical History:  Diagnosis Date  . Anemia   . Anxiety   . Hypertension   . Joint pain   . Palpitations   . RA (rheumatoid arthritis) ([place])   . Sinusitis   . Thalassemia minor   . Upper respiratory infection      Family History  Problem Relation Age of Onset  . Mitral valve prolapse Mother   . Atrial fibrillation Mother   . Rheum arthritis Mother   . Cancer Other        `  . Hypertension Other   . Leukemia Father   . Healthy Daughter   . Breast cancer Neg Hx    Past Surgical History:  Procedure Laterality Date  . CESAREAN SECTION  2011  . KIDNEY SURGERY     age 83   . THERAPEUTIC ABORTION    . TONSILLECTOMY     age 77  . WISDOM TOOTH EXTRACTION     Social History   Social History Narrative  . Not on file   Immunization History  Administered Date(s) Administered  . Influenza Inj Mdck Quad Pf 02/08/2018  . Influenza,inj,Quad PF,6+ Mos 01/30/2015  . Influenza,inj,quad, With Preservative 01/11/2016  . Influenza-Unspecified 01/30/2015, 01/11/2016  . Td 10/05/2002  . Tdap 01/30/2014     Objective: Vital Signs: There were no vitals taken for this visit.   Physical Exam   Musculoskeletal Exam: ***  CDAI Exam: CDAI Score: - Patient Global: -; Provider Global: - Swollen: -; Tender: - Joint Exam   No joint exam has been documented for this visit   There is currently no information documented on the homunculus. Go to the Rheumatology activity and complete the homunculus joint exam.  Investigation: No additional findings.  Imaging: No results found.  Recent Labs: Lab Results  Component Value Date   WBC 4.0 11/23/2018   HGB 10.9 (L) 11/23/2018   PLT  336 11/23/2018   NA 138 11/23/2018   K 4.3 11/23/2018   CL 104 11/23/2018   CO2 24 11/23/2018   GLUCOSE 88 11/23/2018   BUN 15 11/23/2018   CREATININE 0.82 11/23/2018   BILITOT 0.3 11/23/2018   ALKPHOS 69 08/15/2017   AST 14 11/23/2018   ALT 10 11/23/2018   PROT 7.0 11/23/2018   ALBUMIN 4.0 08/15/2017   CALCIUM 9.3 11/23/2018   GFRAA 99 11/23/2018    Speciality Comments: No specialty comments available.  Procedures:  No procedures performed Allergies: Sulfa antibiotics   Assessment / Plan:     Visit  Diagnoses: No diagnosis found.  Orders: No orders of the defined types were placed in this encounter.  No orders of the defined types were placed in this encounter.   Face-to-face time spent with patient was *** minutes. Greater than 50% of time was spent in counseling and coordination of care.  Follow-Up Instructions: No follow-ups on file.   Gearldine Bienenstock, PA-C  Note - This record has been created using Dragon software.  Chart creation errors have been sought, but may not always  have been located. Such creation errors do not reflect on  the standard of medical care.

## 2018-12-29 NOTE — Progress Notes (Signed)
Office Visit Note  Patient: Dominique Harvey             Date of Birth: 08/17/1970           MRN: [id number]             PCP: Charlies Silversouillard, Jaimy, PA-C Referring: Charlies Silversouillard, Shadiyah, [place]* Visit Date: 01/05/2019 Occupation: @GUAROCC @  Subjective:  Occasional arthralgias   History of Present Illness: Dominique Harvey is a 48 y.o. female with history of autoimmune disease.  Patient is taking Imuran 100 mg by mouth daily.  She denies any recent flares.  She has occasional pain in both hands, both knee joints, and both feet.  She denies any joint swelling.  She reports she has not been going to the gym due to COVID-19, so she has been having increased discomfort and stiffness in both knee joints. She is no longer having discomfort in her elbows due to lateral epicondylitis.   She denies any recent rashes but she continues to have photosensitivity. She has not had any hair loss. She denies any symptoms of Raynaud's.  She denies any sores in mouth or nose.  She denies any sicca symptoms.  She denies any fatigue, fevers, or swollen lymph nodes.  She denies any shortness of breath or palpitations.     Activities of Daily Living:  Patient reports morning stiffness for 10 minutes.   Patient Denies nocturnal pain.  Difficulty dressing/grooming: Denies Difficulty climbing stairs: Reports Difficulty getting out of chair: Denies Difficulty using hands for taps, buttons, cutlery, and/or writing: Denies  Review of Systems  Constitutional: Negative for fatigue.  HENT: Negative for mouth sores, mouth dryness and nose dryness.   Eyes: Negative for pain, visual disturbance and dryness.  Respiratory: Negative for cough, hemoptysis, shortness of breath and difficulty breathing.   Cardiovascular: Negative for chest pain, palpitations, hypertension and swelling in legs/feet.  Gastrointestinal: Negative for blood in stool, constipation and diarrhea.  Endocrine: Negative for increased urination.   Genitourinary: Negative for painful urination.  Musculoskeletal: Positive for arthralgias, joint pain and morning stiffness. Negative for joint swelling, myalgias, muscle weakness, muscle tenderness and myalgias.  Skin: Negative for color change, pallor, rash, hair loss, nodules/bumps, skin tightness, ulcers and sensitivity to sunlight.  Allergic/Immunologic: Negative for susceptible to infections.  Neurological: Negative for dizziness, numbness, headaches and weakness.  Hematological: Negative for swollen glands.  Psychiatric/Behavioral: Negative for depressed mood and sleep disturbance. The patient is not nervous/anxious.     PMFS History:  Patient Active Problem List   Diagnosis Date Noted  . Primary osteoarthritis of both knees 03/17/2017  . 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 Rheumatoid arthritis involving multiple sites with positive rheumatoid factor (HCC) 07/14/2016  . SLE-Sjogren overlap syndrome (HCC) 07/14/2016  . High risk medication use 07/14/2016  . History of anemia 07/14/2016  . History of hypertension 07/14/2016  . History of anxiety 07/14/2016  . History of nephrolithiasis 07/14/2016  . Thalassemia minor 07/14/2016  . History of herpes genitalis 07/14/2016  . Palpitations   . Sinusitis   . Joint pain   . Upper respiratory infection   . Anxiety     Past Medical History:  Diagnosis Date  . Anemia   . Anxiety   . Hypertension   . Joint pain   . Palpitations   . RA (rheumatoid arthritis) (HCC)   . Sinusitis   . Thalassemia minor   . Upper respiratory infection  Family History  Problem Relation Age of Onset  . Mitral valve prolapse Mother   . Atrial fibrillation Mother   . Rheum arthritis Mother   . Cancer Other        `  . Hypertension Other   . Leukemia Father   . Healthy Daughter   . Breast cancer Neg Hx    Past Surgical History:  Procedure Laterality Date  .  CESAREAN SECTION  2011  . KIDNEY SURGERY     age 80   . THERAPEUTIC ABORTION    . TONSILLECTOMY     age 7  . WISDOM TOOTH EXTRACTION     Social History   Social History Narrative  . Not on file   Immunization History  Administered Date(s) Administered  . Influenza Inj Mdck Quad Pf 02/08/2018  . Influenza,inj,Quad PF,6+ Mos 01/30/2015  . Influenza,inj,quad, With Preservative 01/11/2016  . Influenza-Unspecified 01/30/2015, 01/11/2016  . Td 10/05/2002  . Tdap 01/30/2014     Objective: Vital Signs: BP (!) 132/92 (BP Location: Left Arm, Patient Position: Sitting, Cuff Size: Normal)   Pulse 85   Resp 12   Ht 5\' 8"  (1.727 m)   Wt 182 lb 9.6 oz (82.8 kg)   BMI 27.76 kg/m    Physical Exam Vitals signs and nursing note reviewed.  Constitutional:      Appearance: She is well-developed.  HENT:     Head: Normocephalic and atraumatic.  Eyes:     Conjunctiva/sclera: Conjunctivae normal.  Neck:     Musculoskeletal: Normal range of motion.  Cardiovascular:     Rate and Rhythm: Normal rate and regular rhythm.     Heart sounds: Normal heart sounds.  Pulmonary:     Effort: Pulmonary effort is normal.     Breath sounds: Normal breath sounds.  Abdominal:     General: Bowel sounds are normal.     Palpations: Abdomen is soft.  Lymphadenopathy:     Cervical: No cervical adenopathy.  Skin:    General: Skin is warm and dry.     Capillary Refill: Capillary refill takes less than 2 seconds.  Neurological:     Mental Status: She is alert and oriented to person, place, and time.  Psychiatric:        Behavior: Behavior normal.      Musculoskeletal Exam: C-spine, thoracic spine, lumbar spine good range of motion.  No midline spinal tenderness.  No SI joint tenderness.  Shoulder joints, elbow joints, wrist joints, MCPs and PIPs and DIPs good range of motion no synovitis.  Hip joints, knee joints and ankle joints, MTPs and PIPs of DIPs good range of motion no synovitis.  No warmth or  effusion of bilateral knee joints.  She has bilateral knee crepitus.  Synovial thickening of the right ankle joint.  No tenderness of the right ankle joint noted.   CDAI Exam: CDAI Score: - Patient Global: -; Provider Global: - Swollen: -; Tender: - Joint Exam   No joint exam has been documented for this visit   There is currently no information documented on the homunculus. Go to the Rheumatology activity and complete the homunculus joint exam.  Investigation: No additional findings.  Imaging: No results found.  Recent Labs: Lab Results  Component Value Date   WBC 4.0 11/23/2018   HGB 10.9 (L) 11/23/2018   PLT 336 11/23/2018   NA 138 11/23/2018   K 4.3 11/23/2018   CL 104 11/23/2018   CO2 24 11/23/2018   GLUCOSE 88 11/23/2018  BUN 15 11/23/2018   CREATININE 0.82 11/23/2018   BILITOT 0.3 11/23/2018   ALKPHOS 69 08/15/2017   AST 14 11/23/2018   ALT 10 11/23/2018   PROT 7.0 11/23/2018   ALBUMIN 4.0 08/15/2017   CALCIUM 9.3 11/23/2018   GFRAA 99 11/23/2018    Speciality Comments: No specialty comments available.  Procedures:  No procedures performed Allergies: Sulfa antibiotics   Assessment / Plan:     Visit Diagnoses: Autoimmune disease Positive ANA, positive Ro, positive RF positive 14-3-3 eta antibody and positive sicca: She denies any signs or symptoms of a flare recently.  She is clinically doing well on Imuran 100 mg by mouth daily.  She has no synovitis on exam.  She has occasional pain in bilateral hands, bilateral knee joints, bilateral feet.  She has synovial thickening of the right ankle joint but has good range of motion with no tenderness or synovitis on exam.  She has not had any recent rashes but continues to have photosensitivity.  We discussed the importance of wearing sunscreen SPF greater than 50 on a daily basis and avoiding the prime hours of sunlight.  She denies any hair loss recently.  She has not had any oral or nasal ulcerations.  No sicca  symptoms.  She has not had any symptoms of Raynaud's.  No digit ulcerations or signs of gangrene were noted.  She has not had any worsening fatigue, fever, or swollen lymph nodes.  She denies any shortness of breath or palpitations.  She will continue taking Imuran as prescribed.  She does not need any refills at this time.  We will check autoimmune lab work today.  She was advised to notify us if she develops any new or worsening symptoms.  She will follow-up in the office in 5 months.  High risk medication use - Imuran 100 mg by mouth daily.  CBC and CMP were drawn on 11/23/2018.  Labs will be drawn today, in December, and every 3 months to monitor for drug toxicity.  Primary osteoarthritis of both knees: She has occasional pain in bilateral knee joints.  She has good range of motion with no discomfort.  No warmth or effusion was noted.  She has bilateral knee crepitus.  She has not been working out on a regular basis due to the gyms being close during COVID-19.  She has been walking and riding bikes and occasion.  She has been experiencing 3 stiffness in both knees.  Discussed importance of lower extremity muscle strengthening and exercising on a regular basis.   Lateral epicondylitis of both elbows: Resolved   Pes planus of both feet: She has occasional pain in both feet.  She wears proper fitting shoes.   Other medical conditions are listed as follows:  History of thalassemia minor - Dr. Myna Hidalgo.  Patient states on folic acid she has been stable.  History of hypertension  History of anxiety  History of nephrolithiasis    Orders: Orders Placed This Encounter  Procedures  . CBC with Differential/Platelet  . COMPLETE METABOLIC PANEL WITH GFR  . Urinalysis, Routine w reflex microscopic  . C3 and C4  . Anti-DNA antibody, double-stranded  . Sedimentation rate   No orders of the defined types were placed in this encounter.    Follow-Up Instructions: Return in 5 months (on 06/07/2019)  for Autoimmune Disease.   Gearldine Bienenstock, PA-C  Note - This record has been created using Dragon software.  Chart creation errors have been sought, but may not  always  have been located. Such creation errors do not reflect on  the standard of medical care.

## 2019-01-02 ENCOUNTER — Ambulatory Visit: Payer: Self-pay | Admitting: Physician Assistant

## 2019-01-05 ENCOUNTER — Ambulatory Visit (INDEPENDENT_AMBULATORY_CARE_PROVIDER_SITE_OTHER): Payer: Managed Care, Other (non HMO) | Admitting: Physician Assistant

## 2019-01-05 ENCOUNTER — Encounter: Payer: Self-pay | Admitting: Physician Assistant

## 2019-01-05 ENCOUNTER — Other Ambulatory Visit: Payer: Self-pay

## 2019-01-05 VITALS — BP 132/92 | HR 85 | Resp 12 | Ht 68.0 in | Wt 182.6 lb

## 2019-01-05 DIAGNOSIS — Z8659 Personal history of other mental and behavioral disorders: Secondary | ICD-10-CM

## 2019-01-05 DIAGNOSIS — M2142 Flat foot [pes planus] (acquired), left foot: Secondary | ICD-10-CM

## 2019-01-05 DIAGNOSIS — M359 Systemic involvement of connective tissue, unspecified: Secondary | ICD-10-CM | POA: Diagnosis not present

## 2019-01-05 DIAGNOSIS — M2141 Flat foot [pes planus] (acquired), right foot: Secondary | ICD-10-CM

## 2019-01-05 DIAGNOSIS — M170 Bilateral primary osteoarthritis of knee: Secondary | ICD-10-CM

## 2019-01-05 DIAGNOSIS — M7711 Lateral epicondylitis, right elbow: Secondary | ICD-10-CM

## 2019-01-05 DIAGNOSIS — Z87442 Personal history of urinary calculi: Secondary | ICD-10-CM

## 2019-01-05 DIAGNOSIS — M7712 Lateral epicondylitis, left elbow: Secondary | ICD-10-CM

## 2019-01-05 DIAGNOSIS — Z79899 Other long term (current) drug therapy: Secondary | ICD-10-CM | POA: Diagnosis not present

## 2019-01-05 DIAGNOSIS — Z8679 Personal history of other diseases of the circulatory system: Secondary | ICD-10-CM

## 2019-01-05 DIAGNOSIS — Z862 Personal history of diseases of the blood and blood-forming organs and certain disorders involving the immune mechanism: Secondary | ICD-10-CM | POA: Diagnosis not present

## 2019-01-09 LAB — COMPLETE METABOLIC PANEL WITH GFR
AG Ratio: 1.5 (calc) (ref 1.0–2.5)
ALT: 14 U/L (ref 6–29)
AST: 18 U/L (ref 10–35)
Albumin: 4.3 g/dL (ref 3.6–5.1)
Alkaline phosphatase (APISO): 49 U/L (ref 31–125)
BUN: 13 mg/dL (ref 7–25)
CO2: 25 mmol/L (ref 20–32)
Calcium: 9.5 mg/dL (ref 8.6–10.2)
Chloride: 105 mmol/L (ref 98–110)
Creat: 0.66 mg/dL (ref 0.50–1.10)
GFR, Est African American: 123 mL/min/{1.73_m2} (ref >=60)
GFR, Est Non African American: 106 mL/min/{1.73_m2} (ref >=60)
Globulin: 2.8 g/dL (calc) (ref 1.9–3.7)
Glucose, Bld: 127 mg/dL — ABNORMAL HIGH (ref 65–99)
Potassium: 4.7 mmol/L (ref 3.5–5.3)
Sodium: 138 mmol/L (ref 135–146)
Total Bilirubin: 0.5 mg/dL (ref 0.2–1.2)
Total Protein: 7.1 g/dL (ref 6.1–8.1)

## 2019-01-09 LAB — URINALYSIS, ROUTINE W REFLEX MICROSCOPIC
Bilirubin Urine: NEGATIVE
Glucose, UA: NEGATIVE
Hgb urine dipstick: NEGATIVE
Ketones, ur: NEGATIVE
Leukocytes,Ua: NEGATIVE
Nitrite: NEGATIVE
Protein, ur: NEGATIVE
Specific Gravity, Urine: 1.019 (ref 1.001–1.03)
pH: 5.5 (ref 5.0–8.0)

## 2019-01-09 LAB — CBC WITH DIFFERENTIAL/PLATELET
Absolute Monocytes: 352 cells/uL (ref 200–950)
Basophils Absolute: 0 cells/uL (ref 0–200)
Basophils Relative: 0 %
Eosinophils Absolute: 41 cells/uL (ref 15–500)
Eosinophils Relative: 1.1 %
HCT: 36.5 % (ref 35.0–45.0)
Hemoglobin: 11 g/dL — ABNORMAL LOW (ref 11.7–15.5)
Lymphs Abs: 333 cells/uL — ABNORMAL LOW (ref 850–3900)
MCH: 20.6 pg — ABNORMAL LOW (ref 27.0–33.0)
MCHC: 30.1 g/dL — ABNORMAL LOW (ref 32.0–36.0)
MCV: 68.2 fL — ABNORMAL LOW (ref 80.0–100.0)
MPV: 12 fL (ref 7.5–12.5)
Monocytes Relative: 9.5 %
Neutro Abs: 2975 cells/uL (ref 1500–7800)
Neutrophils Relative %: 80.4 %
Platelets: 357 10*3/uL (ref 140–400)
RBC: 5.35 10*6/uL — ABNORMAL HIGH (ref 3.80–5.10)
RDW: 16.5 % — ABNORMAL HIGH (ref 11.0–15.0)
Total Lymphocyte: 9 %
WBC: 3.7 10*3/uL — ABNORMAL LOW (ref 3.8–10.8)

## 2019-01-09 LAB — SEDIMENTATION RATE: Sed Rate: 11 mm/h (ref 0–20)

## 2019-01-09 LAB — C3 AND C4
C3 Complement: 144 mg/dL (ref 83–193)
C4 Complement: 24 mg/dL (ref 15–57)

## 2019-01-09 LAB — ANTI-DNA ANTIBODY, DOUBLE-STRANDED: ds DNA Ab: 1 IU/mL

## 2019-01-09 NOTE — Progress Notes (Signed)
DsDNA 1.  No signs of a flare.

## 2019-01-09 NOTE — Progress Notes (Signed)
She has chronic anemia which is mildly improving.  WBC count is borderline low.  We will continue to monitor.   Glucose is elevated-127.  Rest of CMP WNL.  Sed rate WNL.  UA WNL.  Complements WNL.

## 2019-02-19 ENCOUNTER — Other Ambulatory Visit: Payer: Self-pay | Admitting: Physician Assistant

## 2019-02-19 DIAGNOSIS — D561 Beta thalassemia: Secondary | ICD-10-CM

## 2019-02-19 NOTE — Telephone Encounter (Signed)
Last Visit: 01/05/19 Next visit: 06/05/19  Okay to refill per Dr. Estanislado Pandy

## 2019-02-28 ENCOUNTER — Other Ambulatory Visit: Payer: Self-pay | Admitting: Rheumatology

## 2019-02-28 NOTE — Telephone Encounter (Signed)
Last Visit: 01/05/19 Next visit: 06/05/19 Labs: 01/05/19 WBC count is borderline low. Glucose is elevated-127. Rest of CMP WNL.  Okay to refill per Dr. Estanislado Pandy

## 2019-03-23 ENCOUNTER — Other Ambulatory Visit: Payer: Self-pay | Admitting: Rheumatology

## 2019-03-23 DIAGNOSIS — D561 Beta thalassemia: Secondary | ICD-10-CM

## 2019-03-23 NOTE — Telephone Encounter (Signed)
Patient called requesting prescription refill of Azathioprine and Folic Acid to be sent to her [facility] at [employer].    Patient states she lost her job and insurance due to [employer] and is trying to find employment so she can get her labwork and schedule an appointment.

## 2019-03-26 MED ORDER — AZATHIOPRINE 50 MG PO TABS: ORAL_TABLET | ORAL | 0 refills | Status: DC

## 2019-03-26 MED ORDER — FOLIC ACID 1 MG PO TABS: 1.0000 mg | ORAL_TABLET | Freq: Every day | ORAL | 3 refills | Status: DC

## 2019-03-26 NOTE — Telephone Encounter (Signed)
Ok to refill.  Please advise patient to return for lab work in December.

## 2019-03-26 NOTE — Telephone Encounter (Signed)
Last Visit: 01/05/2019 Next Visit: patient will schedule once she has active insurance.  Labs: 01/05/2019 She has chronic anemia which is mildly improving. WBC count is borderline low. We will continue to monitor.   Glucose is elevated-127. Rest of CMP WNL. Sed rate WNL. UA WNL. Complements WNL.   Okay to refill imuran and folic acid?

## 2019-06-22 ENCOUNTER — Other Ambulatory Visit: Payer: Self-pay | Admitting: *Deleted

## 2019-06-22 MED ORDER — AZATHIOPRINE 50 MG PO TABS: ORAL_TABLET | ORAL | 0 refills | Status: DC

## 2019-06-22 NOTE — Telephone Encounter (Signed)
Ok to refill 30 day supply. Please advise patient to update lab work ASAP once her insurance is active.

## 2019-06-22 NOTE — Telephone Encounter (Signed)
Patient was laid off at the end of the year last year. Patient has a new job and insurance which started on July 02, 2019. Patient states once her insurance starts she will update her labs. Patient is requesting a 30 day supply of her medication until then.   Last Visit: 01/05/2019 Next Visit: patient will schedule once she has active insurance.  Labs: 01/05/2019 She has chronic anemia which is mildly improving. WBC count is borderline low. We will continue to monitor.   Glucose is elevated-127. Rest of CMP WNL. Sed rate WNL. UA WNL. Complements WNL.   Okay to refill imuran?

## 2019-07-05 ENCOUNTER — Telehealth: Payer: Self-pay | Admitting: Rheumatology

## 2019-07-05 ENCOUNTER — Other Ambulatory Visit: Payer: Self-pay

## 2019-07-05 DIAGNOSIS — Z79899 Other long term (current) drug therapy: Secondary | ICD-10-CM

## 2019-07-05 DIAGNOSIS — D561 Beta thalassemia: Secondary | ICD-10-CM

## 2019-07-05 MED ORDER — AZATHIOPRINE 50 MG PO TABS: ORAL_TABLET | ORAL | 0 refills | Status: DC

## 2019-07-05 MED ORDER — FOLIC ACID 1 MG PO TABS: 1.0000 mg | ORAL_TABLET | Freq: Every day | ORAL | 3 refills | Status: AC

## 2019-07-05 NOTE — Telephone Encounter (Signed)
Patient needs refill on Folic acid, and Azathioprine sent to [facility] [address]. Patient had labs drawn today. This will be the second week she has not had her medication.

## 2019-07-05 NOTE — Telephone Encounter (Signed)
Last Visit:01/05/2019 Next Visit:08/02/2019 Labs:9/4/2020She has chronic anemia which is mildly improving. WBC count is borderline low. We will continue to monitor.   Glucose is elevated-127. Rest of CMP WNL. Sed rate WNL. UA WNL. Complements WNL.  Patient updated labs today.   Okay to refill per Dr. Corliss Skains

## 2019-07-06 LAB — COMPLETE METABOLIC PANEL WITH GFR
AG Ratio: 1.6 (calc) (ref 1.0–2.5)
ALT: 16 U/L (ref 6–29)
AST: 18 U/L (ref 10–35)
Albumin: 4.3 g/dL (ref 3.6–5.1)
Alkaline phosphatase (APISO): 79 U/L (ref 31–125)
BUN: 20 mg/dL (ref 7–25)
CO2: 26 mmol/L (ref 20–32)
Calcium: 9.6 mg/dL (ref 8.6–10.2)
Chloride: 104 mmol/L (ref 98–110)
Creat: 0.6 mg/dL (ref 0.50–1.10)
GFR, Est African American: 127 mL/min/{1.73_m2} (ref >=60)
GFR, Est Non African American: 109 mL/min/{1.73_m2} (ref >=60)
Globulin: 2.7 g/dL (calc) (ref 1.9–3.7)
Glucose, Bld: 83 mg/dL (ref 65–99)
Potassium: 4.5 mmol/L (ref 3.5–5.3)
Sodium: 136 mmol/L (ref 135–146)
Total Bilirubin: 0.3 mg/dL (ref 0.2–1.2)
Total Protein: 7 g/dL (ref 6.1–8.1)

## 2019-07-06 LAB — CBC WITH DIFFERENTIAL/PLATELET
Absolute Monocytes: 644 cells/uL (ref 200–950)
Basophils Absolute: 18 cells/uL (ref 0–200)
Basophils Relative: 0.4 %
Eosinophils Absolute: 83 cells/uL (ref 15–500)
Eosinophils Relative: 1.8 %
HCT: 34.5 % — ABNORMAL LOW (ref 35.0–45.0)
Hemoglobin: 10.7 g/dL — ABNORMAL LOW (ref 11.7–15.5)
Lymphs Abs: 566 cells/uL — ABNORMAL LOW (ref 850–3900)
MCH: 20.7 pg — ABNORMAL LOW (ref 27.0–33.0)
MCHC: 31 g/dL — ABNORMAL LOW (ref 32.0–36.0)
MCV: 66.9 fL — ABNORMAL LOW (ref 80.0–100.0)
MPV: 11 fL (ref 7.5–12.5)
Monocytes Relative: 14 %
Neutro Abs: 3289 cells/uL (ref 1500–7800)
Neutrophils Relative %: 71.5 %
Platelets: 300 10*3/uL (ref 140–400)
RBC: 5.16 10*6/uL — ABNORMAL HIGH (ref 3.80–5.10)
RDW: 16.3 % — ABNORMAL HIGH (ref 11.0–15.0)
Total Lymphocyte: 12.3 %
WBC: 4.6 10*3/uL (ref 3.8–10.8)

## 2019-07-06 LAB — CBC MORPHOLOGY

## 2019-07-06 NOTE — Progress Notes (Signed)
CMP WNL.  Hgb and Hct are mildly low but stable.  RBC count is borderline elevated but improving. Overall CBC is stable. We will continue to monitor.

## 2019-07-26 NOTE — Progress Notes (Signed)
Office Visit Note  Patient: Dominique Harvey             Date of Birth: [birth year]           MRN: [id number]             PCP: Dewayne Shorter, PA-C Referring: Dewayne Shorter, [place]* Visit Date: 08/02/2019 Occupation: @GUAROCC @  Subjective:  Pain in multiple joints   History of Present Illness: Dominique Harvey is a 49 y.o. female with history of autoimmune disease and osteoarthritis.  Patient is taking Imuran 100 mg by mouth daily.  She has been experiencing increased arthralgias and joint stiffness over the past several months.  She has ongoing pain in both ankle joints, both knee joints, and bilateral lateral epicondylitis of both elbows.  She has persistent swelling in the right ankle joint.  She denies any injuries in the past.  She states that at times the discomfort is so severe she has difficulty bearing weight.  She states that since last summer she is also had a rash intermittently on her chest.  She has tried several topical agents prescribed by her PCP.  Most recently she has been applying over-the-counter cortisone cream and Benadryl cream as needed.  She has an upcoming appointment with dermatology on 09/20/2019.  She has not had any recent facial rashes.  She denies any oral nasal ulcerations.  She has not had any sicca symptoms.  She denies any symptoms of Raynaud's.  She denies any enlarged lymph nodes.  She has not had any hair loss or photosensitivity recently.   Activities of Daily Living:  Patient reports joint stiffness all day  Patient Reports nocturnal pain.  Difficulty dressing/grooming: Denies Difficulty climbing stairs: Reports Difficulty getting out of chair: Denies Difficulty using hands for taps, buttons, cutlery, and/or writing: Reports  Review of Systems  Constitutional: Positive for fatigue.  HENT: Negative for mouth sores, mouth dryness and nose dryness.   Eyes: Negative for itching and dryness.  Respiratory: Negative for shortness of breath and  difficulty breathing.   Cardiovascular: Negative for chest pain and palpitations.  Gastrointestinal: Negative for blood in stool, constipation and diarrhea.  Endocrine: Negative for increased urination.  Genitourinary: Negative for difficulty urinating and painful urination.  Musculoskeletal: Positive for arthralgias, joint pain, joint swelling, muscle weakness and morning stiffness.  Skin: Positive for rash and redness. Negative for color change and hair loss.  Allergic/Immunologic: Negative for susceptible to infections.  Neurological: Negative for dizziness, numbness, headaches, memory loss and weakness.  Hematological: Negative for bruising/bleeding tendency.  Psychiatric/Behavioral: Negative for confusion and sleep disturbance.    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 07/14/2016   Rheumatoid arthritis involving multiple sites with positive rheumatoid factor ([place]) 07/14/2016   SLE-Sjogren overlap syndrome ([place]) 07/14/2016   High risk medication use 07/14/2016   History of anemia 07/14/2016   History of hypertension 07/14/2016   History of anxiety 07/14/2016   History of nephrolithiasis 07/14/2016   Thalassemia minor 07/14/2016   History of herpes genitalis 07/14/2016   Palpitations    Sinusitis    Joint pain    Upper respiratory infection    Anxiety     Past Medical History:  Diagnosis Date   Anemia    Anxiety    Hypertension    Joint pain  Palpitations    RA (rheumatoid arthritis) (HCC)    Sinusitis    Thalassemia minor    Upper respiratory infection     Family History  Problem Relation Age of Onset   Mitral valve prolapse Mother    Atrial fibrillation Mother    Rheum arthritis Mother    Cancer Other        `   Hypertension Other    Leukemia  Father    Healthy Daughter    Breast cancer Neg Hx    Past Surgical History:  Procedure Laterality Date   CESAREAN SECTION  2011   KIDNEY SURGERY     age 71    THERAPEUTIC ABORTION     TONSILLECTOMY     age 52   WISDOM TOOTH EXTRACTION     Social History   Social History Narrative   Not on file   Immunization History  Administered Date(s) Administered   Influenza Inj Mdck Quad Pf 02/08/2018   Influenza,inj,Quad PF,6+ Mos 01/30/2015   Influenza,inj,quad, With Preservative 01/11/2016   Influenza-Unspecified 01/30/2015, 01/11/2016   Td 10/05/2002   Tdap 01/30/2014     Objective: Vital Signs: BP 132/88 (BP Location: Left Arm, Patient Position: Sitting, Cuff Size: Normal)    Pulse (!) 103    Resp 16    Ht 5\' 8"  (1.727 m)    Wt 189 lb 6.4 oz (85.9 kg)    BMI 28.80 kg/m    Physical Exam Vitals and nursing note reviewed.  Constitutional:      Appearance: She is well-developed.  HENT:     Head: Normocephalic and atraumatic.  Eyes:     Conjunctiva/sclera: Conjunctivae normal.  Pulmonary:     Effort: Pulmonary effort is normal.  Abdominal:     General: Bowel sounds are normal.     Palpations: Abdomen is soft.  Musculoskeletal:     Cervical back: Normal range of motion.  Lymphadenopathy:     Cervical: No cervical adenopathy.  Skin:    General: Skin is warm and dry.     Capillary Refill: Capillary refill takes less than 2 seconds.  Neurological:     Mental Status: She is alert and oriented to person, place, and time.  Psychiatric:        Behavior: Behavior normal.      Musculoskeletal Exam: C-spine, thoracic spine, lumbar spine good range of motion.  No midline spinal tenderness.  Shoulder joints, elbow joints, wrist joints, MCPs, PIPs and DIPs good range of motion with no synovitis.  She has tenderness over the lateral epicondyle of both elbow joints.  She has complete fist formation bilaterally.  Hip joints have good range of motion with no discomfort.   Knee joints have good range of motion with no warmth or effusion.  She has bilateral knee crepitus.  Left ankle has good range of motion with tenderness but no inflammation.  Right ankle joint has tenderness and inflammation.  She has pain with plantarflexion dorsiflexion of the right ankle.  No tenderness of MTP or PIP joints.  CDAI Exam: CDAI Score: -- Patient Global: --; Provider Global: -- Swollen: 1 ; Tender: 1  Joint Exam 08/02/2019      Right  Left  Ankle  Swollen Tender        Investigation: No additional findings.  Imaging: No results found.  Recent Labs: Lab Results  Component Value Date   WBC 4.6 07/05/2019   HGB 10.7 (L) 07/05/2019   PLT 300 07/05/2019   NA 136  07/05/2019   K 4.5 07/05/2019   CL 104 07/05/2019   CO2 26 07/05/2019   GLUCOSE 83 07/05/2019   BUN 20 07/05/2019   CREATININE 0.60 07/05/2019   BILITOT 0.3 07/05/2019   ALKPHOS 69 08/15/2017   AST 18 07/05/2019   ALT 16 07/05/2019   PROT 7.0 07/05/2019   ALBUMIN 4.0 08/15/2017   CALCIUM 9.6 07/05/2019   GFRAA 127 07/05/2019    Speciality Comments: No specialty comments available.  Procedures:  No procedures performed Allergies: Sulfa antibiotics   Assessment / Plan:     Visit Diagnoses: Autoimmune disease Positive ANA, positive Ro, positive RF positive 14-3-3 eta antibody and positive sicca: She has ongoing tenderness and inflammation in the right ankle joint.  She has been experiencing increased arthralgias and joint stiffness over the past several months.  She has no other joint synovitis on exam today.  She has a persistent rash on her chest.  Erythematous macular rash with circumscribed margins and surrounding erythema noted on her chest. She has tried several topical agents prescribed by her PCP but has not noticed much improvement.  She was encouraged to use triamcinolone topically.  She was advised to wear sunscreen and avoid direct sun exposure.  She has an upcoming dermatology appointment  on 09/20/2019.  She has not had any other signs or symptoms of a flare.  She continues to take Imuran 100 mg by mouth daily.  She has not missed any doses of Imuran recently.  Her double-stranded DNA was negative, complements within normal limits, and sed rate within normal limits on 01/05/2019.  She has not had any oral or nasal ulcerations, sicca symptoms, symptoms of Raynaud's, hair loss, or photosensitivity.  No malar rash was noted.  No digital ulcerations or signs of gangrene were noted.  She is good capillary refill on exam.  She will continue taking Imuran as prescribed.  Future orders for autoimmune lab work were placed today.  She will return for lab work in June.  She was advised to notify us if she develops any signs or symptoms of a flare.  She will follow-up in the office in 5 months.  High risk medication use - Imuran 100 mg by mouth daily.  CBC and CMP were drawn on 07/05/2019.  Anemia is stable.  CMP was within normal limits.  She will return for lab work in June and every 3 months.  Future orders will be placed today.  We will check autoimmune lab work at that time.  Primary osteoarthritis of both knees: She has good range of motion of both knee joints on exam.  No warmth or effusion was noted.  Has bilateral knee crepitus.  She has difficulty climbing steps due to the discomfort in both knees.  He has been experiencing intermittent stiffness in both knees.  We discussed importance lower extremity muscle strengthening.  Pes planus of both feet: We discussed importance of wearing proper fitting shoes.  Chronic pain of right ankle: She has chronic pain in the right ankle joint.  She has tenderness and synovitis of the right ankle joint on exam.  Her discomfort is so severe at times she has difficulty bearing weight.  She has pain with plantar flexion dorsiflexion of the right ankle joint on exam.  We will schedule an ultrasound-guided right ankle joint cortisone injection.  We discussed using  Voltaren gel topically as needed in the meantime.  Lateral epicondylitis of both elbows -She has tenderness to palpation over bilateral lateral epicondyles.  She was given a handout of exercises to perform.  She can use Voltaren gel topically as needed for pain relief.  Rash and nonspecific skin eruption  Other medical conditions are listed as follows:   History of thalassemia minor - Dr. Myna Hidalgo.  History of anxiety  History of nephrolithiasis  History of hypertension  Orders: Orders Placed This Encounter  Procedures   CBC with Differential/Platelet   COMPLETE METABOLIC PANEL WITH GFR   Urinalysis, Routine w reflex microscopic   Anti-DNA antibody, double-stranded   C3 and C4   Sedimentation rate   Meds ordered this encounter  Medications   azaTHIOprine (IMURAN) 50 MG tablet    Sig: TAKE 2 TABLETS (100 MG) AT BEDTIME    Dispense:  180 tablet    Refill:  0      Follow-Up Instructions: Return in about 5 months (around 01/02/2020) for Autoimmune Disease.   Gearldine Bienenstock, PA-C  Note - This record has been created using Dragon software.  Chart creation errors have been sought, but may not always  have been located. Such creation errors do not reflect on  the standard of medical care.

## 2019-08-02 ENCOUNTER — Encounter: Payer: Self-pay | Admitting: Physician Assistant

## 2019-08-02 ENCOUNTER — Ambulatory Visit (INDEPENDENT_AMBULATORY_CARE_PROVIDER_SITE_OTHER): Payer: BC Managed Care – PPO | Admitting: Physician Assistant

## 2019-08-02 ENCOUNTER — Other Ambulatory Visit: Payer: Self-pay

## 2019-08-02 VITALS — BP 132/88 | HR 103 | Resp 16 | Ht 68.0 in | Wt 189.4 lb

## 2019-08-02 DIAGNOSIS — M359 Systemic involvement of connective tissue, unspecified: Secondary | ICD-10-CM

## 2019-08-02 DIAGNOSIS — M7711 Lateral epicondylitis, right elbow: Secondary | ICD-10-CM

## 2019-08-02 DIAGNOSIS — M2142 Flat foot [pes planus] (acquired), left foot: Secondary | ICD-10-CM

## 2019-08-02 DIAGNOSIS — Z8679 Personal history of other diseases of the circulatory system: Secondary | ICD-10-CM

## 2019-08-02 DIAGNOSIS — Z79899 Other long term (current) drug therapy: Secondary | ICD-10-CM

## 2019-08-02 DIAGNOSIS — R21 Rash and other nonspecific skin eruption: Secondary | ICD-10-CM

## 2019-08-02 DIAGNOSIS — M170 Bilateral primary osteoarthritis of knee: Secondary | ICD-10-CM

## 2019-08-02 DIAGNOSIS — M2141 Flat foot [pes planus] (acquired), right foot: Secondary | ICD-10-CM | POA: Diagnosis not present

## 2019-08-02 DIAGNOSIS — G8929 Other chronic pain: Secondary | ICD-10-CM

## 2019-08-02 DIAGNOSIS — Z8659 Personal history of other mental and behavioral disorders: Secondary | ICD-10-CM

## 2019-08-02 DIAGNOSIS — M25571 Pain in right ankle and joints of right foot: Secondary | ICD-10-CM

## 2019-08-02 DIAGNOSIS — Z87442 Personal history of urinary calculi: Secondary | ICD-10-CM

## 2019-08-02 DIAGNOSIS — M7712 Lateral epicondylitis, left elbow: Secondary | ICD-10-CM

## 2019-08-02 DIAGNOSIS — Z862 Personal history of diseases of the blood and blood-forming organs and certain disorders involving the immune mechanism: Secondary | ICD-10-CM

## 2019-08-02 MED ORDER — AZATHIOPRINE 50 MG PO TABS: ORAL_TABLET | ORAL | 0 refills | Status: DC

## 2019-08-02 NOTE — Patient Instructions (Signed)
[facility] °Ask your health care provider which exercises are safe for you. Do exercises exactly as told by your health care provider and adjust them as directed. It is normal to feel mild stretching, pulling, tightness, or discomfort as you do these exercises. Stop right away if you feel sudden pain or your pain gets worse. Do not begin these exercises until told by your health care provider. °Stretching and range-of-motion exercises °These exercises warm up your muscles and joints and improve the movement and flexibility of your elbow. These exercises also help to relieve pain, numbness, and tingling. °Wrist flexion, assisted ° °1. Straighten your left / right elbow in front of you with your palm facing down toward the floor. °? If told by your health care provider, bend your left / right elbow to a 90-degree angle (right angle) at your side. °2. With your other hand, gently push over the back of your left / right hand so your fingers point toward the floor (flexion). Stop when you feel a gentle stretch on the back of your forearm. °3. Hold this position for __________ seconds. °Repeat __________ times. Complete this exercise __________ times a day. °Wrist extension, assisted ° °1. Straighten your left / right elbow in front of you with your palm facing up toward the ceiling. °? If told by your health care provider, bend your left / right elbow to a 90-degree angle (right angle) at your side. °2. With your other hand, gently pull your left / right hand and fingers toward the floor (extension). Stop when you feel a gentle stretch on the palm side of your forearm. °3. Hold this position for __________ seconds. °Repeat __________ times. Complete this exercise __________ times a day. °Assisted forearm rotation, supination °1. Sit or stand with your left / right elbow bent to a 90-degree angle (right angle) at your side. °2. Using your uninjured hand, turn (rotate) your left / right palm up toward the ceiling  (supination) until you feel a gentle stretch along the inside of your forearm. °3. Hold this position for __________ seconds. °Repeat __________ times. Complete this exercise __________ times a day. °Assisted forearm rotation, pronation °1. Sit or stand with your left / right elbow bent to a 90-degree angle (right angle) at your side. °2. Using your uninjured hand, rotate your left / right palm down toward the floor (pronation) until you feel a gentle stretch along the outside of your forearm. °3. Hold this position for __________ seconds. °Repeat __________ times. Complete this exercise __________ times a day. °Strengthening exercises °These exercises build strength and endurance in your forearm and elbow. Endurance is the ability to use your muscles for a long time, even after they get tired. °Radial deviation ° °1. Stand with a __________ weight or a hammer in your left / right hand. Or, sit while holding a rubber exercise band or tubing, with your left / right forearm supported on a table or countertop. °? If you are standing, position your forearm so that your thumb is facing forward. If you are sitting, position your forearm so that the thumb is facing the ceiling. This is the neutral position. °2. Raise your hand upward in front of you so your thumb moves toward the ceiling (radial deviation), or pull up on the rubber tubing. Keep your forearm and elbow still while you move your wrist only. °3. Hold this position for __________ seconds. °4. Slowly return to the starting position. °Repeat __________ times. Complete this exercise __________ times   a day. °Wrist extension, eccentric °1. Sit with your left / right forearm palm-down and supported on a table or other surface. Let your left / right wrist extend over the edge of the surface. °2. Hold a __________ weight or a piece of exercise band or tubing in your left / right hand. °? If using a rubber exercise band or tubing, hold the other end of the tubing with  your other hand. °3. Use your uninjured hand to move your left / right hand up toward the ceiling. °4. Take your uninjured hand away and slowly return to the starting position using only your left / right hand. Lowering your arm under tension is called eccentric extension. °Repeat __________ times. Complete this exercise __________ times a day. °Wrist extension °Do not do this exercise if it causes pain at the outside of your elbow. Only do this exercise once instructed by your health care provider. °1. Sit with your left / right forearm supported on a table or other surface and your palm turned down toward the floor. Let your left / right wrist extend over the edge of the surface. °2. Hold a __________ weight or a piece of rubber exercise band or tubing. °? If you are using a rubber exercise band or tubing, hold the band or tubing in place with your other hand to provide resistance. °3. Slowly bend your wrist so your hand moves up toward the ceiling (extension). Move only your wrist, keeping your forearm and elbow still. °4. Hold this position for __________ seconds. °5. Slowly return to the starting position. °Repeat __________ times. Complete this exercise __________ times a day. °Forearm rotation, supination °To do this exercise, you will need a lightweight hammer or rubber mallet. °1. Sit with your left / right forearm supported on a table or other surface. Bend your elbow to a 90-degree angle (right angle). Position your forearm so that your palm is facing down toward the floor, with your hand resting over the edge of the table. °2. Hold a hammer in your left / right hand. °? To make this exercise easier, hold the hammer near the head of the hammer. °? To make this exercise harder, hold the hammer near the end of the handle. °3. Without moving your wrist or elbow, slowly rotate your forearm so your palm faces up toward the ceiling (supination). °4. Hold this position for __________ seconds. °5. Slowly return  to the starting position. °Repeat __________ times. Complete this exercise __________ times a day. °Shoulder blade squeeze °1. Sit in a stable chair or stand with good posture. If you are sitting down, do not let your back touch the back of the chair. °2. Your arms should be at your sides with your elbows bent to a 90-degree angle (right angle). Position your forearms so that your thumbs are facing the ceiling (neutral position). °3. Without lifting your shoulders up, squeeze your shoulder blades tightly together. °4. Hold this position for __________ seconds. °5. Slowly release and return to the starting position. °Repeat __________ times. Complete this exercise __________ times a day. °This information is not intended to replace advice given to you by your health care provider. Make sure you discuss any questions you have with your health care provider. °Document Revised: 08/10/2018 Document Reviewed: 06/13/2018 °Elsevier Patient Education © 2020 [employer] ° °

## 2019-09-05 ENCOUNTER — Other Ambulatory Visit: Payer: BC Managed Care – PPO | Admitting: Rheumatology

## 2019-10-10 ENCOUNTER — Other Ambulatory Visit: Payer: BC Managed Care – PPO | Admitting: Rheumatology

## 2019-10-29 ENCOUNTER — Telehealth: Payer: Self-pay | Admitting: Rheumatology

## 2019-10-29 ENCOUNTER — Other Ambulatory Visit: Payer: Self-pay | Admitting: Physician Assistant

## 2019-10-29 NOTE — Telephone Encounter (Signed)
Verified verbal prescription with Sherron Ales, PA-C to send in Imuran 100 mg by mouth daily # 60 no refills.

## 2019-10-29 NOTE — Telephone Encounter (Signed)
Left message for patient to advise we will be sending in a 30 day supply for Imuran and she will need labs prior to the next refill .

## 2019-10-29 NOTE — Telephone Encounter (Signed)
Patient called requesting prescription refill of Azathioprine to be sent to [facility].  Patient is asking if Dr. Corliss Skains could refill prescription and she will have her labwork done next month.  Patient states she is going out of town on Friday, 11/02/19.

## 2019-10-29 NOTE — Telephone Encounter (Signed)
Last Visit: 08/02/2019 Next Visit: 01/02/2020 Labs: 07/05/2019 CMP WNL. Hgb and Hct are mildly low but stable. RBC count is borderline elevated but improving.  Current Dose per office note on 08/02/2019: Imuran 100 mg by mouth daily  Attempted to contact patient and left message to advise patient she is due to update labs.   Okay to refill 30 day supply Imuran?

## 2019-10-29 NOTE — Telephone Encounter (Signed)
Ok to refill 30-day supply of Imuran.

## 2019-11-30 ENCOUNTER — Other Ambulatory Visit: Payer: Self-pay

## 2019-11-30 DIAGNOSIS — Z79899 Other long term (current) drug therapy: Secondary | ICD-10-CM

## 2019-11-30 DIAGNOSIS — M359 Systemic involvement of connective tissue, unspecified: Secondary | ICD-10-CM

## 2019-12-03 ENCOUNTER — Other Ambulatory Visit: Payer: Self-pay | Admitting: *Deleted

## 2019-12-03 LAB — CBC WITH DIFFERENTIAL/PLATELET
Absolute Monocytes: 494 cells/uL (ref 200–950)
Basophils Absolute: 11 cells/uL (ref 0–200)
Basophils Relative: 0.3 %
Eosinophils Absolute: 80 cells/uL (ref 15–500)
Eosinophils Relative: 2.1 %
HCT: 34.2 % — ABNORMAL LOW (ref 35.0–45.0)
Hemoglobin: 10.2 g/dL — ABNORMAL LOW (ref 11.7–15.5)
Lymphs Abs: 456 cells/uL — ABNORMAL LOW (ref 850–3900)
MCH: 20.5 pg — ABNORMAL LOW (ref 27.0–33.0)
MCHC: 29.8 g/dL — ABNORMAL LOW (ref 32.0–36.0)
MCV: 68.7 fL — ABNORMAL LOW (ref 80.0–100.0)
MPV: 11.4 fL (ref 7.5–12.5)
Monocytes Relative: 13 %
Neutro Abs: 2759 cells/uL (ref 1500–7800)
Neutrophils Relative %: 72.6 %
Platelets: 297 10*3/uL (ref 140–400)
RBC: 4.98 10*6/uL (ref 3.80–5.10)
RDW: 16.3 % — ABNORMAL HIGH (ref 11.0–15.0)
Total Lymphocyte: 12 %
WBC: 3.8 10*3/uL (ref 3.8–10.8)

## 2019-12-03 LAB — COMPLETE METABOLIC PANEL WITH GFR
AG Ratio: 1.8 (calc) (ref 1.0–2.5)
ALT: 9 U/L (ref 6–29)
AST: 14 U/L (ref 10–35)
Albumin: 4.2 g/dL (ref 3.6–5.1)
Alkaline phosphatase (APISO): 79 U/L (ref 31–125)
BUN: 15 mg/dL (ref 7–25)
CO2: 28 mmol/L (ref 20–32)
Calcium: 9.2 mg/dL (ref 8.6–10.2)
Chloride: 104 mmol/L (ref 98–110)
Creat: 0.71 mg/dL (ref 0.50–1.10)
GFR, Est African American: 118 mL/min/{1.73_m2} (ref >=60)
GFR, Est Non African American: 101 mL/min/{1.73_m2} (ref >=60)
Globulin: 2.4 g/dL (calc) (ref 1.9–3.7)
Glucose, Bld: 73 mg/dL (ref 65–99)
Potassium: 4.2 mmol/L (ref 3.5–5.3)
Sodium: 138 mmol/L (ref 135–146)
Total Bilirubin: 0.3 mg/dL (ref 0.2–1.2)
Total Protein: 6.6 g/dL (ref 6.1–8.1)

## 2019-12-03 LAB — URINALYSIS, ROUTINE W REFLEX MICROSCOPIC
Bilirubin Urine: NEGATIVE
Glucose, UA: NEGATIVE
Hgb urine dipstick: NEGATIVE
Ketones, ur: NEGATIVE
Leukocytes,Ua: NEGATIVE
Nitrite: NEGATIVE
Protein, ur: NEGATIVE
Specific Gravity, Urine: 1.014 (ref 1.001–1.03)
pH: 6.5 (ref 5.0–8.0)

## 2019-12-03 LAB — SEDIMENTATION RATE: Sed Rate: 9 mm/h (ref 0–20)

## 2019-12-03 LAB — C3 AND C4
C3 Complement: 120 mg/dL (ref 83–193)
C4 Complement: 18 mg/dL (ref 15–57)

## 2019-12-03 LAB — ANTI-DNA ANTIBODY, DOUBLE-STRANDED: ds DNA Ab: 1 IU/mL

## 2019-12-03 MED ORDER — AZATHIOPRINE 50 MG PO TABS: ORAL_TABLET | ORAL | 0 refills | Status: DC

## 2019-12-03 NOTE — Telephone Encounter (Signed)
Patient requested refill of Imuran  Last Visit: 08/02/2019 Next Visit: 01/02/2020 Labs: 11/30/2019 Hgb and hct are low and trending down. WBC count is WNL. CMP WNL  Current Dose per office note 08/02/2019 : Imuran 100 mg by mouth daily DX: Autoimmune disease  Okay to refill Imuran?

## 2019-12-03 NOTE — Progress Notes (Signed)
Hgb and hct are low and trending down. Please forward to PCP. WBC count is WNL.  CMP WNL.  ESR WNL.  UA normal.  Complements WNL.

## 2019-12-04 NOTE — Progress Notes (Signed)
DsDNA is negative.

## 2019-12-14 ENCOUNTER — Other Ambulatory Visit: Payer: Self-pay | Admitting: Physician Assistant

## 2019-12-14 DIAGNOSIS — Z1231 Encounter for screening mammogram for malignant neoplasm of breast: Secondary | ICD-10-CM

## 2019-12-19 NOTE — Progress Notes (Deleted)
Office Visit Note  Patient: Dominique Harvey             Date of Birth: 1971/03/16           MRN: [id number]             PCP: Charlies Silvers, PA-C Referring: Charlies Silvers, [place]* Visit Date: 01/02/2020 Occupation: @GUAROCC @  Subjective:  No chief complaint on file.   History of Present Illness: Dominique Harvey is a 49 y.o. female ***   Activities of Daily Living:  Patient reports morning stiffness for *** {minute/hour:19697}.   Patient {ACTIONS;DENIES/REPORTS:21021675::"Denies"} nocturnal pain.  Difficulty dressing/grooming: {ACTIONS;DENIES/REPORTS:21021675::"Denies"} Difficulty climbing stairs: {ACTIONS;DENIES/REPORTS:21021675::"Denies"} Difficulty getting out of chair: {ACTIONS;DENIES/REPORTS:21021675::"Denies"} Difficulty using hands for taps, buttons, cutlery, and/or writing: {ACTIONS;DENIES/REPORTS:21021675::"Denies"}  No Rheumatology ROS completed.   PMFS History:  Patient Active Problem List   Diagnosis Date Noted  . Primary osteoarthritis of both knees 03/17/2017  . 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 Rheumatoid arthritis involving multiple sites with positive rheumatoid factor (HCC) 07/14/2016  . SLE-Sjogren overlap syndrome (HCC) 07/14/2016  . High risk medication use 07/14/2016  . History of anemia 07/14/2016  . History of hypertension 07/14/2016  . History of anxiety 07/14/2016  . History of nephrolithiasis 07/14/2016  . Thalassemia minor 07/14/2016  . History of herpes genitalis 07/14/2016  . Palpitations   . Sinusitis   . Joint pain   . Upper respiratory infection   . Anxiety     Past Medical History:  Diagnosis Date  . Anemia   . Anxiety   . Hypertension   . Joint pain   . Palpitations   . RA (rheumatoid arthritis) (HCC)   . Sinusitis   . Thalassemia minor   . Upper respiratory infection     Family History  Problem Relation Age of Onset  .  Mitral valve prolapse Mother   . Atrial fibrillation Mother   . Rheum arthritis Mother   . Cancer Other        `  . Hypertension Other   . Leukemia Father   . Healthy Daughter   . Breast cancer Neg Hx    Past Surgical History:  Procedure Laterality Date  . CESAREAN SECTION  2011  . KIDNEY SURGERY     age 57   . THERAPEUTIC ABORTION    . TONSILLECTOMY     age 42  . WISDOM TOOTH EXTRACTION     Social History   Social History Narrative  . Not on file   Immunization History  Administered Date(s) Administered  . Influenza Inj Mdck Quad Pf 02/08/2018  . Influenza,inj,Quad PF,6+ Mos 01/30/2015  . Influenza,inj,quad, With Preservative 01/11/2016  . Influenza-Unspecified 01/30/2015, 01/11/2016  . Td 10/05/2002  . Tdap 01/30/2014     Objective: Vital Signs: There were no vitals taken for this visit.   Physical Exam   Musculoskeletal Exam: ***  CDAI Exam: CDAI Score: -- Patient Global: --; Provider Global: -- Swollen: --; Tender: -- Joint Exam 01/02/2020   No joint exam has been documented for this visit   There is currently no information documented on the homunculus. Go to the Rheumatology activity and complete the homunculus joint exam.  Investigation: No additional findings.  Imaging: No results found.  Recent Labs: Lab Results  Component Value Date   WBC 3.8 11/30/2019   HGB 10.2 (L) 11/30/2019   PLT 297 11/30/2019   NA 138 11/30/2019   K 4.2  11/30/2019   CL 104 11/30/2019   CO2 28 11/30/2019   GLUCOSE 73 11/30/2019   BUN 15 11/30/2019   CREATININE 0.71 11/30/2019   BILITOT 0.3 11/30/2019   ALKPHOS 69 08/15/2017   AST 14 11/30/2019   ALT 9 11/30/2019   PROT 6.6 11/30/2019   ALBUMIN 4.0 08/15/2017   CALCIUM 9.2 11/30/2019   GFRAA 118 11/30/2019    Speciality Comments: No specialty comments available.  Procedures:  No procedures performed Allergies: Sulfa antibiotics   Assessment / Plan:     Visit Diagnoses: Autoimmune disease Positive  ANA, positive Ro, positive RF positive 14-3-3 eta antibody and positive sicca  High risk medication use - Imuran 100 mg by mouth daily.   Primary osteoarthritis of both knees  Pes planus of both feet  Chronic pain of right ankle  Lateral epicondylitis of both elbows  Rash and nonspecific skin eruption  History of thalassemia minor  History of anxiety  History of nephrolithiasis  History of hypertension  Orders: No orders of the defined types were placed in this encounter.  No orders of the defined types were placed in this encounter.   Face-to-face time spent with patient was *** minutes. Greater than 50% of time was spent in counseling and coordination of care.  Follow-Up Instructions: No follow-ups on file.   Gearldine Bienenstock, PA-C  Note - This record has been created using Dragon software.  Chart creation errors have been sought, but may not always  have been located. Such creation errors do not reflect on  the standard of medical care.

## 2020-01-02 ENCOUNTER — Ambulatory Visit: Payer: BC Managed Care – PPO | Admitting: Physician Assistant

## 2020-01-02 DIAGNOSIS — Z862 Personal history of diseases of the blood and blood-forming organs and certain disorders involving the immune mechanism: Secondary | ICD-10-CM

## 2020-01-02 DIAGNOSIS — Z8679 Personal history of other diseases of the circulatory system: Secondary | ICD-10-CM

## 2020-01-02 DIAGNOSIS — Z87442 Personal history of urinary calculi: Secondary | ICD-10-CM

## 2020-01-02 DIAGNOSIS — M170 Bilateral primary osteoarthritis of knee: Secondary | ICD-10-CM

## 2020-01-02 DIAGNOSIS — Z8659 Personal history of other mental and behavioral disorders: Secondary | ICD-10-CM

## 2020-01-02 DIAGNOSIS — M7711 Lateral epicondylitis, right elbow: Secondary | ICD-10-CM

## 2020-01-02 DIAGNOSIS — G8929 Other chronic pain: Secondary | ICD-10-CM

## 2020-01-02 DIAGNOSIS — M2141 Flat foot [pes planus] (acquired), right foot: Secondary | ICD-10-CM

## 2020-01-02 DIAGNOSIS — M359 Systemic involvement of connective tissue, unspecified: Secondary | ICD-10-CM

## 2020-01-02 DIAGNOSIS — Z79899 Other long term (current) drug therapy: Secondary | ICD-10-CM

## 2020-01-02 DIAGNOSIS — R21 Rash and other nonspecific skin eruption: Secondary | ICD-10-CM

## 2020-01-08 ENCOUNTER — Ambulatory Visit: Payer: BC Managed Care – PPO

## 2020-01-08 NOTE — Progress Notes (Deleted)
Office Visit Note  Patient: Dominique Harvey             Date of Birth: [birth year]           MRN: [id number]             PCP: Charlies Silvers, PA-C Referring: Charlies Silvers, [place]* Visit Date: 01/22/2020 Occupation: @GUAROCC @  Subjective:  No chief complaint on file.   History of Present Illness: Dominique Harvey is a 49 y.o. female ***   Activities of Daily Living:  Patient reports morning stiffness for *** {minute/hour:19697}.   Patient {ACTIONS;DENIES/REPORTS:21021675::"Denies"} nocturnal pain.  Difficulty dressing/grooming: {ACTIONS;DENIES/REPORTS:21021675::"Denies"} Difficulty climbing stairs: {ACTIONS;DENIES/REPORTS:21021675::"Denies"} Difficulty getting out of chair: {ACTIONS;DENIES/REPORTS:21021675::"Denies"} Difficulty using hands for taps, buttons, cutlery, and/or writing: {ACTIONS;DENIES/REPORTS:21021675::"Denies"}  No Rheumatology ROS completed.   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 07/14/2016   Rheumatoid arthritis involving multiple sites with positive rheumatoid factor (HCC) 07/14/2016   SLE-Sjogren overlap syndrome (HCC) 07/14/2016   High risk medication use 07/14/2016   History of anemia 07/14/2016   History of hypertension 07/14/2016   History of anxiety 07/14/2016   History of nephrolithiasis 07/14/2016   Thalassemia minor 07/14/2016   History of herpes genitalis 07/14/2016   Palpitations    Sinusitis    Joint pain    Upper respiratory infection    Anxiety     Past Medical History:  Diagnosis Date   Anemia    Anxiety    Hypertension    Joint pain    Palpitations    RA (rheumatoid arthritis) (HCC)    Sinusitis    Thalassemia minor    Upper respiratory infection     Family History  Problem Relation Age of Onset    Mitral valve prolapse Mother    Atrial fibrillation Mother    Rheum arthritis Mother    Cancer Other        `   Hypertension Other    Leukemia Father    Healthy Daughter    Breast cancer Neg Hx    Past Surgical History:  Procedure Laterality Date   CESAREAN SECTION  2011   KIDNEY SURGERY     age 11    THERAPEUTIC ABORTION     TONSILLECTOMY     age 54   WISDOM TOOTH EXTRACTION     Social History   Social History Narrative   Not on file   Immunization History  Administered Date(s) Administered   Influenza Inj Mdck Quad Pf 02/08/2018   Influenza,inj,Quad PF,6+ Mos 01/30/2015   Influenza,inj,quad, With Preservative 01/11/2016   Influenza-Unspecified 01/30/2015, 01/11/2016   Td 10/05/2002   Tdap 01/30/2014     Objective: Vital Signs: There were no vitals taken for this visit.   Physical Exam   Musculoskeletal Exam: ***  CDAI Exam: CDAI Score: -- Patient Global: --; Provider Global: -- Swollen: --; Tender: -- Joint Exam 01/22/2020   No joint exam has been documented for this visit   There is currently no information documented on the homunculus. Go to the Rheumatology activity and complete the homunculus joint exam.  Investigation: No additional findings.  Imaging: No results found.  Recent Labs: Lab Results  Component Value Date   WBC 3.8 11/30/2019   HGB 10.2 (L) 11/30/2019   PLT 297 11/30/2019   NA 138 11/30/2019   K 4.2  11/30/2019   CL 104 11/30/2019   CO2 28 11/30/2019   GLUCOSE 73 11/30/2019   BUN 15 11/30/2019   CREATININE 0.71 11/30/2019   BILITOT 0.3 11/30/2019   ALKPHOS 69 08/15/2017   AST 14 11/30/2019   ALT 9 11/30/2019   PROT 6.6 11/30/2019   ALBUMIN 4.0 08/15/2017   CALCIUM 9.2 11/30/2019   GFRAA 118 11/30/2019    Speciality Comments: No specialty comments available.  Procedures:  No procedures performed Allergies: Sulfa antibiotics   Assessment / Plan:     Visit Diagnoses: Autoimmune disease Positive  ANA, positive Ro, positive RF positive 14-3-3 eta antibody and positive sicca  High risk medication use  Primary osteoarthritis of both knees  Pes planus of both feet  Lateral epicondylitis of both elbows  History of thalassemia minor  History of anxiety  History of nephrolithiasis  History of hypertension  Orders: No orders of the defined types were placed in this encounter.  No orders of the defined types were placed in this encounter.   Face-to-face time spent with patient was *** minutes. Greater than 50% of time was spent in counseling and coordination of care.  Follow-Up Instructions: No follow-ups on file.   Gearldine Bienenstock, PA-C  Note - This record has been created using Dragon software.  Chart creation errors have been sought, but may not always  have been located. Such creation errors do not reflect on  the standard of medical care.

## 2020-01-22 ENCOUNTER — Ambulatory Visit: Payer: BC Managed Care – PPO | Admitting: Physician Assistant

## 2020-01-29 ENCOUNTER — Other Ambulatory Visit: Payer: Self-pay

## 2020-01-29 ENCOUNTER — Ambulatory Visit: Payer: Self-pay

## 2020-01-29 ENCOUNTER — Other Ambulatory Visit: Payer: Self-pay | Admitting: Family Medicine

## 2020-01-29 DIAGNOSIS — M25561 Pain in right knee: Secondary | ICD-10-CM

## 2020-02-01 NOTE — Progress Notes (Signed)
Office Visit Note  Patient: Dominique Harvey             Date of Birth: 01-29-1971           MRN: [id number]             PCP: Dewayne Shorter, PA-C Referring: Dewayne Shorter, [place]* Visit Date: 02/14/2020 Occupation: _0 @  Subjective:  Medication monitoring   History of Present Illness: Dominique Harvey is a 49 y.o. female with history of autoimmune disease.  Patient is taking Imuran 1 mg by mouth daily. She has not missed any doses of Imuran recently.  She denies any signs or symptoms of a flare recently.  She states that she recently slipped at work and has had some increased discomfort in her right knee joint.  She plans on seeing her orthopedist for further evaluation.  She denies any warmth or joint swelling in her knee at this time.  She denies any other joint pain or joint swelling.  She has not had any oral or nasal ulcerations, sicca symptoms, symptoms of Raynaud's, enlarged lymph nodes, chest pain, or shortness of breath recently.  She states that she continues to have intermittent rashes on her chest and was evaluated by her dermatologist.  She underwent patch testing which was unremarkable.  She is taking zytrec and xyzal daily and using Flucinonide cream topically as needed. She denies any other new or worsening symptoms.   She denies any recent infections.   Activities of Daily Living:  Patient reports morning stiffness for several  hours.   Patient Reports nocturnal pain.  Difficulty dressing/grooming: Denies Difficulty climbing stairs: Reports Difficulty getting out of chair: Denies Difficulty using hands for taps, buttons, cutlery, and/or writing: Denies  Review of Systems  Constitutional: Positive for fatigue.  HENT: Negative for mouth sores, mouth dryness and nose dryness.   Eyes: Negative for pain, itching and dryness.  Respiratory: Negative for shortness of breath and difficulty breathing.   Cardiovascular: Negative for chest pain and palpitations.    Gastrointestinal: Negative for blood in stool, constipation and diarrhea.  Endocrine: Negative for increased urination.  Genitourinary: Negative for difficulty urinating and painful urination.  Musculoskeletal: Positive for arthralgias, joint pain and morning stiffness. Negative for joint swelling, myalgias, muscle tenderness and myalgias.  Skin: Negative for color change, rash and redness.  Allergic/Immunologic: Negative for susceptible to infections.  Neurological: Negative for dizziness, numbness, headaches, memory loss and weakness.  Hematological: Negative for bruising/bleeding tendency.  Psychiatric/Behavioral: Negative for confusion and sleep disturbance.    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 07/14/2016   Rheumatoid arthritis involving multiple sites with positive rheumatoid factor ([place]) 07/14/2016   SLE-Sjogren overlap syndrome ([place]) 07/14/2016   High risk medication use 07/14/2016   History of anemia 07/14/2016   History of hypertension 07/14/2016   History of anxiety 07/14/2016   History of nephrolithiasis 07/14/2016   Thalassemia minor 07/14/2016   History of herpes genitalis 07/14/2016   Palpitations    Sinusitis    Joint pain    Upper respiratory infection    Anxiety     Past Medical History:  Diagnosis Date   Anemia    Anxiety    Hypertension    Joint pain    Palpitations    RA (rheumatoid arthritis) ([place])    Sinusitis  Thalassemia minor    Upper respiratory infection     Family History  Problem Relation Age of Onset   Mitral valve prolapse Mother    Atrial fibrillation Mother    Rheum arthritis Mother    Cancer Other        `   Hypertension Other    Leukemia Father    Healthy Daughter    Breast cancer Neg Hx    Past Surgical  History:  Procedure Laterality Date   CESAREAN SECTION  2011   KIDNEY SURGERY     age 62    THERAPEUTIC ABORTION     TONSILLECTOMY     age 66   WISDOM TOOTH EXTRACTION     Social History   Social History Narrative   Not on file   Immunization History  Administered Date(s) Administered   Influenza Inj Mdck Quad Pf 02/08/2018   Influenza,inj,Quad PF,6+ Mos 01/30/2015   Influenza,inj,quad, With Preservative 01/11/2016   Influenza-Unspecified 01/30/2015, 01/11/2016   PFIZER SARS-COV-2 Vaccination 07/05/2019, 07/27/2019, 12/29/2019   Td 10/05/2002   Tdap 01/30/2014     Objective: Vital Signs: BP (!) 148/91 (BP Location: Left Arm, Patient Position: Sitting, Cuff Size: Normal)    Pulse 92    Resp 15    Ht _0  (1.727 m)    Wt 186 lb 12.8 oz (84.7 kg)    LMP 01/29/2020    BMI 28.40 kg/m    Physical Exam Vitals and nursing note reviewed.  Constitutional:      Appearance: She is well-developed.  HENT:     Head: Normocephalic and atraumatic.  Eyes:     Conjunctiva/sclera: Conjunctivae normal.  Pulmonary:     Effort: Pulmonary effort is normal.  Abdominal:     Palpations: Abdomen is soft.  Musculoskeletal:     Cervical back: Normal range of motion.  Skin:    General: Skin is warm and dry.     Capillary Refill: Capillary refill takes less than 2 seconds.  Neurological:     Mental Status: She is alert and oriented to person, place, and time.  Psychiatric:        Behavior: Behavior normal.      Musculoskeletal Exam: C-spine, thoracic spine, and lumbar spine have good range of motion.  Shoulder joints, elbow joints, wrist joints, MCPs, PIPs, DIPs have good range of motion with no synovitis.  She is able to make a complete fist bilaterally.  Hip joints have good range of motion with no discomfort.  Knee joints have good range of motion with crepitus bilaterally.  No warmth or effusion of knee joints noted.  Ankle joints have good range of motion with no tenderness  or inflammation.  No tenderness of MTP joints.  CDAI Exam: CDAI Score: -- Patient Global: --; Provider Global: -- Swollen: --; Tender: -- Joint Exam 02/14/2020   No joint exam has been documented for this visit   There is currently no information documented on the homunculus. Go to the Rheumatology activity and complete the homunculus joint exam.  Investigation: No additional findings.  Imaging: DG Knee Complete 4 Views Right  Result Date: 01/29/2020 CLINICAL DATA:  Pain following recent fall EXAM: RIGHT KNEE - COMPLETE 4+ VIEW COMPARISON:  None. FINDINGS: Frontal, lateral, and bilateral oblique views were obtained. No fracture or dislocation. No appreciable joint effusion. Joint spaces appear unremarkable. No erosive change. There is minimal spurring medially. IMPRESSION: No fracture, dislocation, or joint effusion. Minimal spurring medially. No joint space narrowing. Electronically Signed  By: Lowella Grip III M.D.   On: 01/29/2020 11:00    Recent Labs: Lab Results  Component Value Date   WBC 3.8 11/30/2019   HGB 10.2 (L) 11/30/2019   PLT 297 11/30/2019   NA 138 11/30/2019   K 4.2 11/30/2019   CL 104 11/30/2019   CO2 28 11/30/2019   GLUCOSE 73 11/30/2019   BUN 15 11/30/2019   CREATININE 0.71 11/30/2019   BILITOT 0.3 11/30/2019   ALKPHOS 69 08/15/2017   AST 14 11/30/2019   ALT 9 11/30/2019   PROT 6.6 11/30/2019   ALBUMIN 4.0 08/15/2017   CALCIUM 9.2 11/30/2019   GFRAA 118 11/30/2019    Speciality Comments: No specialty comments available.  Procedures:  No procedures performed Allergies: Sulfa antibiotics   Assessment / Plan:     Visit Diagnoses: Autoimmune disease Positive ANA, positive Ro, positive RF positive 14-3-3 eta antibody and positive sicca -She has not had any signs or symptoms of a systemic autoimmune flare. She is clinically doing well on Imuran 100 mg by mouth daily. She has not missed any doses of Imuran recently and is tolerating it without any  side effects. She is not experiencing any joint pain or inflammation at this time. She has not had any morning stiffness or nocturnal pain. She recently was evaluated by her dermatologist for a rash on her chest which according to the patient was thought to be due to contact dermatitis. She has started taking Zyrtec and Xyzal on a daily basis. She has also been using fluocinonide cream topically as needed. We discussed the importance of wearing sunscreen SPF greater than 50 on a daily basis and avoiding direct sun exposure. She has not had any symptoms of Raynaud's but we discussed the importance of avoiding triggers. No digital stations or signs of gangrene were noted on examination today. She does have a delayed capillary refill to 2 to 3 seconds. She has not had any recent oral or nasal ulcerations. She is not experiencing any sicca symptoms at this time. No cervical lymphadenopathy was palpable. She has not had any chest pain, palpitations, or shortness of breath recently. Lab work from 11/30/2019 was reviewed with the patient today in the office: Double-stranded DNA negative, complements within normal limits, ESR within normal limits, and no proteinuria. She is due to update CBC and CMP today so orders were released. Future orders for autoimmune labs were placed today. She will continue taking Imuran 100 mg by mouth daily. She does not need any refills at this time. She will be relocating to [place] at which time she will establish with a new PCP and be referred to a rheumatologist. We will send records once requested. She will follow-up with [place] as needed. Plan: Anti-DNA antibody, double-stranded, C3 and C4, Sedimentation rate, Urinalysis, Routine w reflex microscopic, COMPLETE METABOLIC PANEL WITH GFR, CBC with Differential/Platelet  High risk medication use - Imuran 100 mg by mouth daily. CBC and CMP were drawn on 11/30/2019. She is due to update lab work today. Orders for CBC and CMP were released. She will  require updated lab work in January and every 3 months to monitor for drug toxicity.- Plan: COMPLETE METABOLIC PANEL WITH GFR, CBC with Differential/Platelet, COMPLETE METABOLIC PANEL WITH GFR, CBC with Differential/Platelet She has received all 3 COVID-19 vaccine doses. She has not had any recent infections. She was advised to hold Imuran if she develops signs or symptoms of an infection and to resume once the infection has completely cleared. She  was advised to notify us or her PCP if she develops a COVID-19 infection in order to receive the monoclonal antibody infusion. She voiced understanding.  Primary osteoarthritis of both knees: She has good range of motion of both knee joints with crepitus bilaterally. No warmth or effusion was noted. She recently slipped and has had some increased discomfort in her right knee joint since then. She has an upcoming appointment with her orthopedist for further evaluation.  Pes planus of both feet: We discussed the importance of wearing proper fitting shoes.  Chronic pain of right ankle: She has tenderness and warmth of the right ankle joint on examination today.  Lateral epicondylitis of both elbows: She has tenderness to palpation on exam today.    Rash and nonspecific skin eruption: She recently followed up with her dermatologist. Patch testing unremarkable. She was diagnosed with contact dermatitis and has been using fluocinonide cream topically.   History of thalassemia minor - Dr. Marin Olp.  Other medical conditions are listed as follows:  History of nephrolithiasis  History of anxiety  History of hypertension  Orders: Orders Placed This Encounter  Procedures   COMPLETE METABOLIC PANEL WITH GFR   CBC with Differential/Platelet   Anti-DNA antibody, double-stranded   C3 and C4   Sedimentation rate   Urinalysis, Routine w reflex microscopic   COMPLETE METABOLIC PANEL WITH GFR   CBC with Differential/Platelet   No orders of the  defined types were placed in this encounter.    Follow-Up Instructions: Return if symptoms worsen or fail to improve, for Autoimmune Disease, Osteoarthritis.   Ofilia Neas, PA-C  Note - This record has been created using Dragon software.  Chart creation errors have been sought, but may not always  have been located. Such creation errors do not reflect on  the standard of medical care.

## 2020-02-06 ENCOUNTER — Other Ambulatory Visit: Payer: Self-pay | Admitting: Physician Assistant

## 2020-02-06 NOTE — Telephone Encounter (Signed)
Last Visit: 08/02/2019 Next Visit: 02/14/2020 Labs: 11/30/2019 Hgb and hct are low and trending down. WBC count is WNL. CMP WNL  Patient to update labs a follow up appointment on 02/14/2020.  Current Dose per office note on 08/02/2019: Imuran 100 mg by mouth daily  Okay to refill Imuran?

## 2020-02-14 ENCOUNTER — Other Ambulatory Visit: Payer: Self-pay

## 2020-02-14 ENCOUNTER — Ambulatory Visit (INDEPENDENT_AMBULATORY_CARE_PROVIDER_SITE_OTHER): Payer: BC Managed Care – PPO | Admitting: Physician Assistant

## 2020-02-14 ENCOUNTER — Encounter: Payer: Self-pay | Admitting: Physician Assistant

## 2020-02-14 VITALS — BP 148/91 | HR 92 | Resp 15 | Ht 68.0 in | Wt 186.8 lb

## 2020-02-14 DIAGNOSIS — M170 Bilateral primary osteoarthritis of knee: Secondary | ICD-10-CM

## 2020-02-14 DIAGNOSIS — M359 Systemic involvement of connective tissue, unspecified: Secondary | ICD-10-CM

## 2020-02-14 DIAGNOSIS — M7711 Lateral epicondylitis, right elbow: Secondary | ICD-10-CM

## 2020-02-14 DIAGNOSIS — Z79899 Other long term (current) drug therapy: Secondary | ICD-10-CM

## 2020-02-14 DIAGNOSIS — R21 Rash and other nonspecific skin eruption: Secondary | ICD-10-CM

## 2020-02-14 DIAGNOSIS — M2141 Flat foot [pes planus] (acquired), right foot: Secondary | ICD-10-CM

## 2020-02-14 DIAGNOSIS — Z87442 Personal history of urinary calculi: Secondary | ICD-10-CM

## 2020-02-14 DIAGNOSIS — Z8679 Personal history of other diseases of the circulatory system: Secondary | ICD-10-CM

## 2020-02-14 DIAGNOSIS — M7712 Lateral epicondylitis, left elbow: Secondary | ICD-10-CM

## 2020-02-14 DIAGNOSIS — Z862 Personal history of diseases of the blood and blood-forming organs and certain disorders involving the immune mechanism: Secondary | ICD-10-CM

## 2020-02-14 DIAGNOSIS — M25571 Pain in right ankle and joints of right foot: Secondary | ICD-10-CM

## 2020-02-14 DIAGNOSIS — M2142 Flat foot [pes planus] (acquired), left foot: Secondary | ICD-10-CM

## 2020-02-14 DIAGNOSIS — G8929 Other chronic pain: Secondary | ICD-10-CM

## 2020-02-14 DIAGNOSIS — Z8659 Personal history of other mental and behavioral disorders: Secondary | ICD-10-CM

## 2020-02-14 NOTE — Patient Instructions (Signed)
Standing Labs We placed an order today for your standing lab work.   Please have your standing labs drawn in January and every 3 months   If possible, please have your labs drawn 2 weeks prior to your appointment so that the provider can discuss your results at your appointment.  We have open lab daily Monday through Thursday from 8:30-12:30 PM and 1:30-4:30 PM and Friday from 8:30-12:30 PM and 1:30-4:00 PM at the office of Dr. Shaili Deveshwar, [facility].   Please be advised, patients with office appointments requiring lab work will take precedents over walk-in lab work.  If possible, please come for your lab work on Monday and Friday afternoons, as you may experience shorter wait times. The office is located at [address], [address], [place], [place] [address] No appointment is necessary.   Labs are drawn by [facility]. Please bring your co-pay at the time of your lab draw.  You may receive a bill from [facility] for your lab work.  If you wish to have your labs drawn at another location, please call the office 24 hours in advance to send orders.  If you have any questions regarding directions or hours of operation,  please call [phone].   As a reminder, please drink plenty of water prior to coming for your lab work. Thanks! COVID-19 vaccine recommendations:   COVID-19 vaccine is recommended for everyone (unless you are allergic to a vaccine component), even if you are on a medication that suppresses your immune system.   If you are on Methotrexate, Cellcept (mycophenolate), Rinvoq, Xeljanz, and Olumiant- hold the medication for 1 week after each vaccine. Hold Methotrexate for 2 weeks after the single dose COVID-19 vaccine.   If you are on Orencia subcutaneous injection - hold medication one week prior to and one week after the first COVID-19 vaccine dose (only).   If you are on Orencia IV infusions- time vaccination administration so that the first COVID-19 vaccination  will occur four weeks after the infusion and postpone the subsequent infusion by one week.   If you are on Cyclophosphamide or Rituxan infusions please contact your doctor prior to receiving the COVID-19 vaccine.   Do not take Tylenol or any anti-inflammatory medications (NSAIDs) 24 hours prior to the COVID-19 vaccination.   There is no direct evidence about the efficacy of the COVID-19 vaccine in individuals who are on medications that suppress the immune system.   Even if you are fully vaccinated, and you are on any medications that suppress your immune system, please continue to wear a mask, maintain at least six feet social distance and practice hand hygiene.   If you develop a COVID-19 infection, please contact your PCP or our office to determine if you need antibody infusion.  The booster vaccine is now available for immunocompromised patients. It is advised that if you had Pfizer vaccine you should get Pfizer booster.  If you had a Moderna vaccine then you should get a Moderna booster. Johnson and Johnson does not have a booster vaccine at this time.  Please see the following web sites for updated information.   [email or web address]://[email or web address]/[email or web address]/[email or web address]-Vaccination-Patient-Resources.[email or web address]  [email or web address]://[email or web address]/About-Us/[email or web address]-Releases/ID/1159     

## 2020-02-15 LAB — CBC WITH DIFFERENTIAL/PLATELET
Absolute Monocytes: 421 cells/uL (ref 200–950)
Basophils Absolute: 11 cells/uL (ref 0–200)
Basophils Relative: 0.3 %
Eosinophils Absolute: 68 cells/uL (ref 15–500)
Eosinophils Relative: 1.9 %
HCT: 33.6 % — ABNORMAL LOW (ref 35.0–45.0)
Hemoglobin: 10.5 g/dL — ABNORMAL LOW (ref 11.7–15.5)
Lymphs Abs: 338 cells/uL — ABNORMAL LOW (ref 850–3900)
MCH: 20.8 pg — ABNORMAL LOW (ref 27.0–33.0)
MCHC: 31.3 g/dL — ABNORMAL LOW (ref 32.0–36.0)
MCV: 66.4 fL — ABNORMAL LOW (ref 80.0–100.0)
MPV: 12.1 fL (ref 7.5–12.5)
Monocytes Relative: 11.7 %
Neutro Abs: 2761 cells/uL (ref 1500–7800)
Neutrophils Relative %: 76.7 %
Platelets: 354 10*3/uL (ref 140–400)
RBC: 5.06 10*6/uL (ref 3.80–5.10)
RDW: 16 % — ABNORMAL HIGH (ref 11.0–15.0)
Total Lymphocyte: 9.4 %
WBC: 3.6 10*3/uL — ABNORMAL LOW (ref 3.8–10.8)

## 2020-02-15 LAB — COMPLETE METABOLIC PANEL WITH GFR
AG Ratio: 1.6 (calc) (ref 1.0–2.5)
ALT: 10 U/L (ref 6–29)
AST: 12 U/L (ref 10–35)
Albumin: 4.3 g/dL (ref 3.6–5.1)
Alkaline phosphatase (APISO): 77 U/L (ref 31–125)
BUN: 14 mg/dL (ref 7–25)
CO2: 25 mmol/L (ref 20–32)
Calcium: 9.2 mg/dL (ref 8.6–10.2)
Chloride: 105 mmol/L (ref 98–110)
Creat: 0.65 mg/dL (ref 0.50–1.10)
GFR, Est African American: 123 mL/min/{1.73_m2} (ref >=60)
GFR, Est Non African American: 106 mL/min/{1.73_m2} (ref >=60)
Globulin: 2.7 g/dL (calc) (ref 1.9–3.7)
Glucose, Bld: 84 mg/dL (ref 65–99)
Potassium: 4.1 mmol/L (ref 3.5–5.3)
Sodium: 138 mmol/L (ref 135–146)
Total Bilirubin: 0.5 mg/dL (ref 0.2–1.2)
Total Protein: 7 g/dL (ref 6.1–8.1)

## 2020-02-15 NOTE — Progress Notes (Signed)
Reviewed lab work with Dr. Corliss Skains.   CMP WNL.   WBC count is low-3.6.  hgb and hct remain low.   She will be moving to [place] and establishing care with a new PCP and eventually a rheumatologist.   Please advise the patient to have CBC with diff checked in 1 month if possible.

## 2020-03-31 ENCOUNTER — Other Ambulatory Visit: Payer: Self-pay | Admitting: Physician Assistant

## 2020-06-06 ENCOUNTER — Other Ambulatory Visit: Payer: Self-pay | Admitting: Physician Assistant

## 2020-06-06 NOTE — Telephone Encounter (Signed)
Last Visit: 02/14/2020 Next Visit: Patient moved to [place], patient is establishing with new rheumatologist in [place].  Labs: 02/14/2020, Reviewed lab work with Dr. Corliss Skains.  CMP WNL.  WBC count is low-3.6. hgb and hct remain low.  She will be moving to [place] and establishing care with a new PCP and eventually a rheumatologist.  Please advise the patient to have CBC with diff checked in 1 month if possible.   Patient requesting 1 month supply.  Current Dose per office note 02/14/2020, Imuran 100 mg by mouth daily DX:  Autoimmune disease Positive ANA, positive Ro, positive RF positive 14-3-3 eta antibody and positive sicca  Okay to refill Imuran?

## 2020-08-08 ENCOUNTER — Other Ambulatory Visit: Payer: Self-pay | Admitting: Rheumatology

## 2020-08-08 DIAGNOSIS — D561 Beta thalassemia: Secondary | ICD-10-CM

## 2020-08-08 NOTE — Telephone Encounter (Signed)
LMOM is patient still taking folic acid? Patient is not on MTX.

## 2020-10-30 IMAGING — DX DG KNEE COMPLETE 4+V*R*
4 series · 4 of 4 positions shown · non-contrast
Comparison: None.

CLINICAL DATA: Pain following recent fall

EXAM:
RIGHT KNEE - COMPLETE 4+ VIEW

[knee pa]
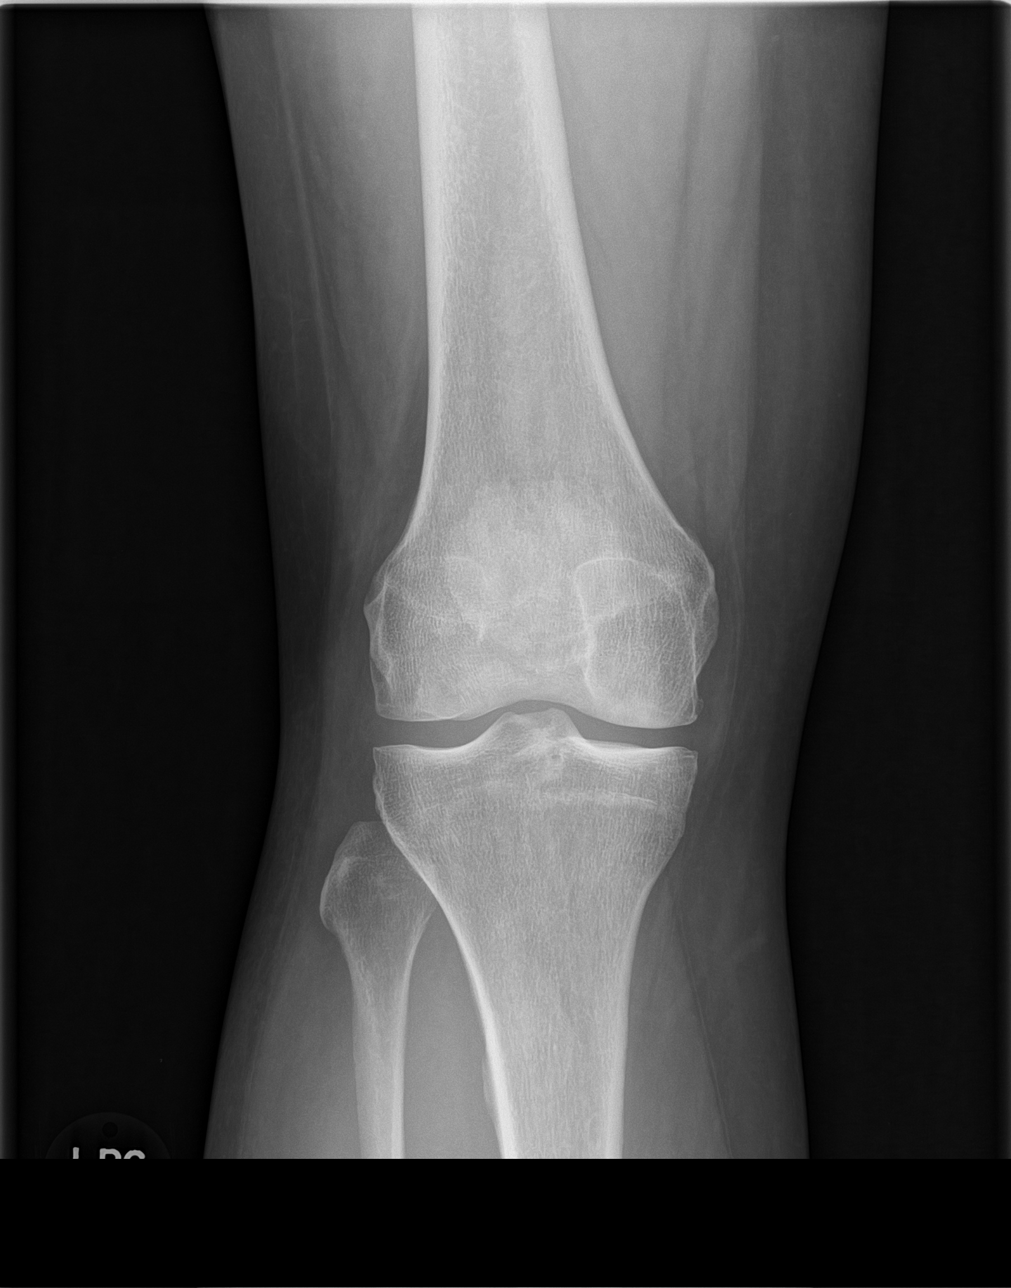

[knee obl (1 of 2)]
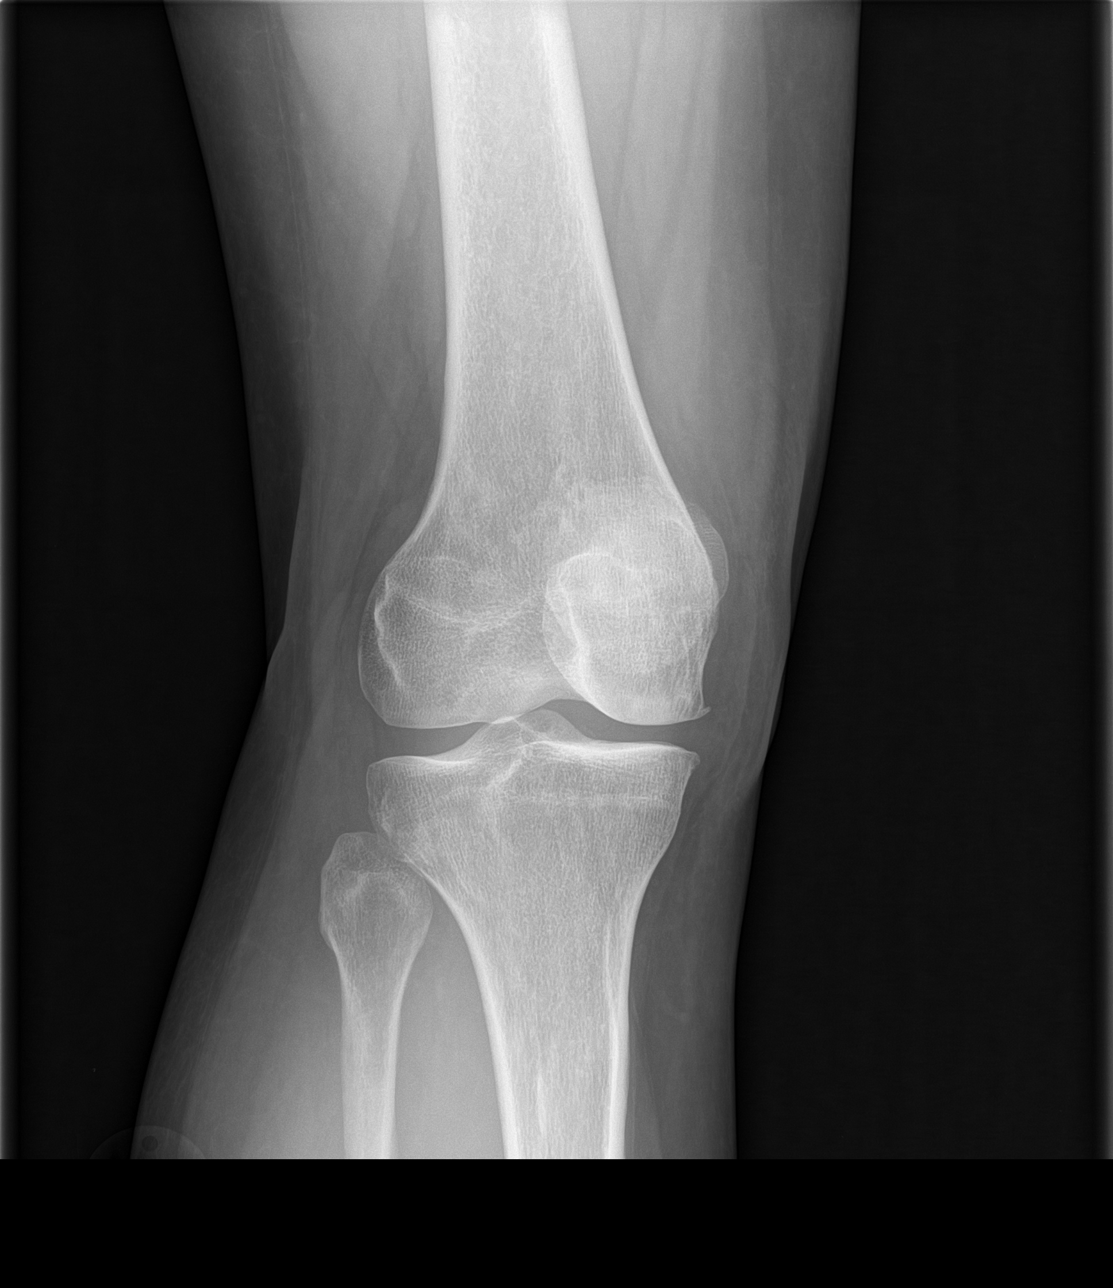

[knee obl (2 of 2)]
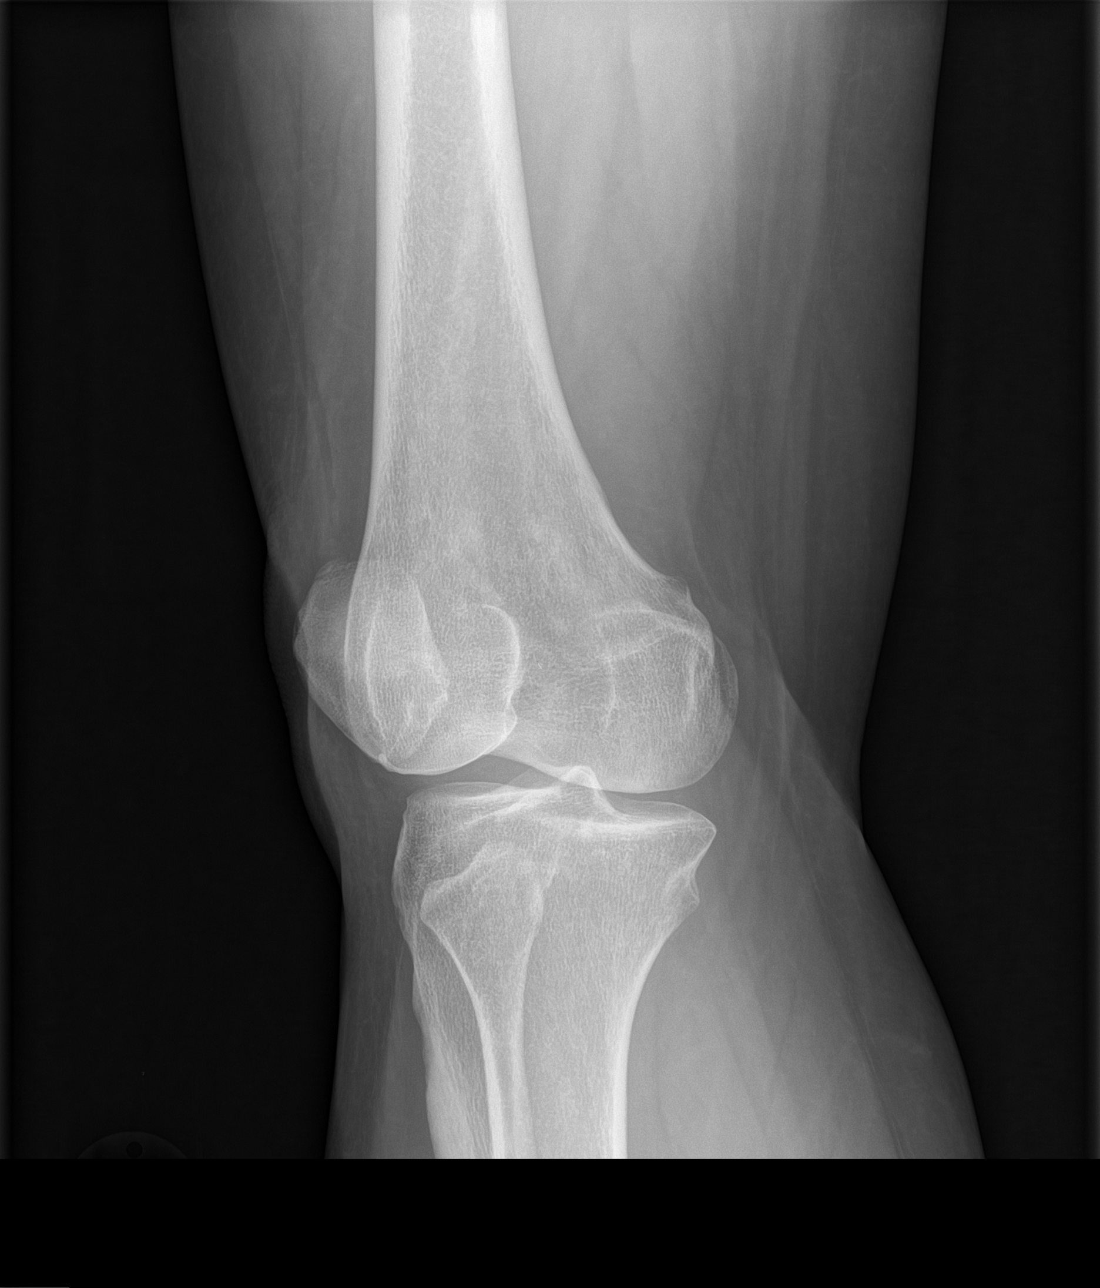

[knee lat]
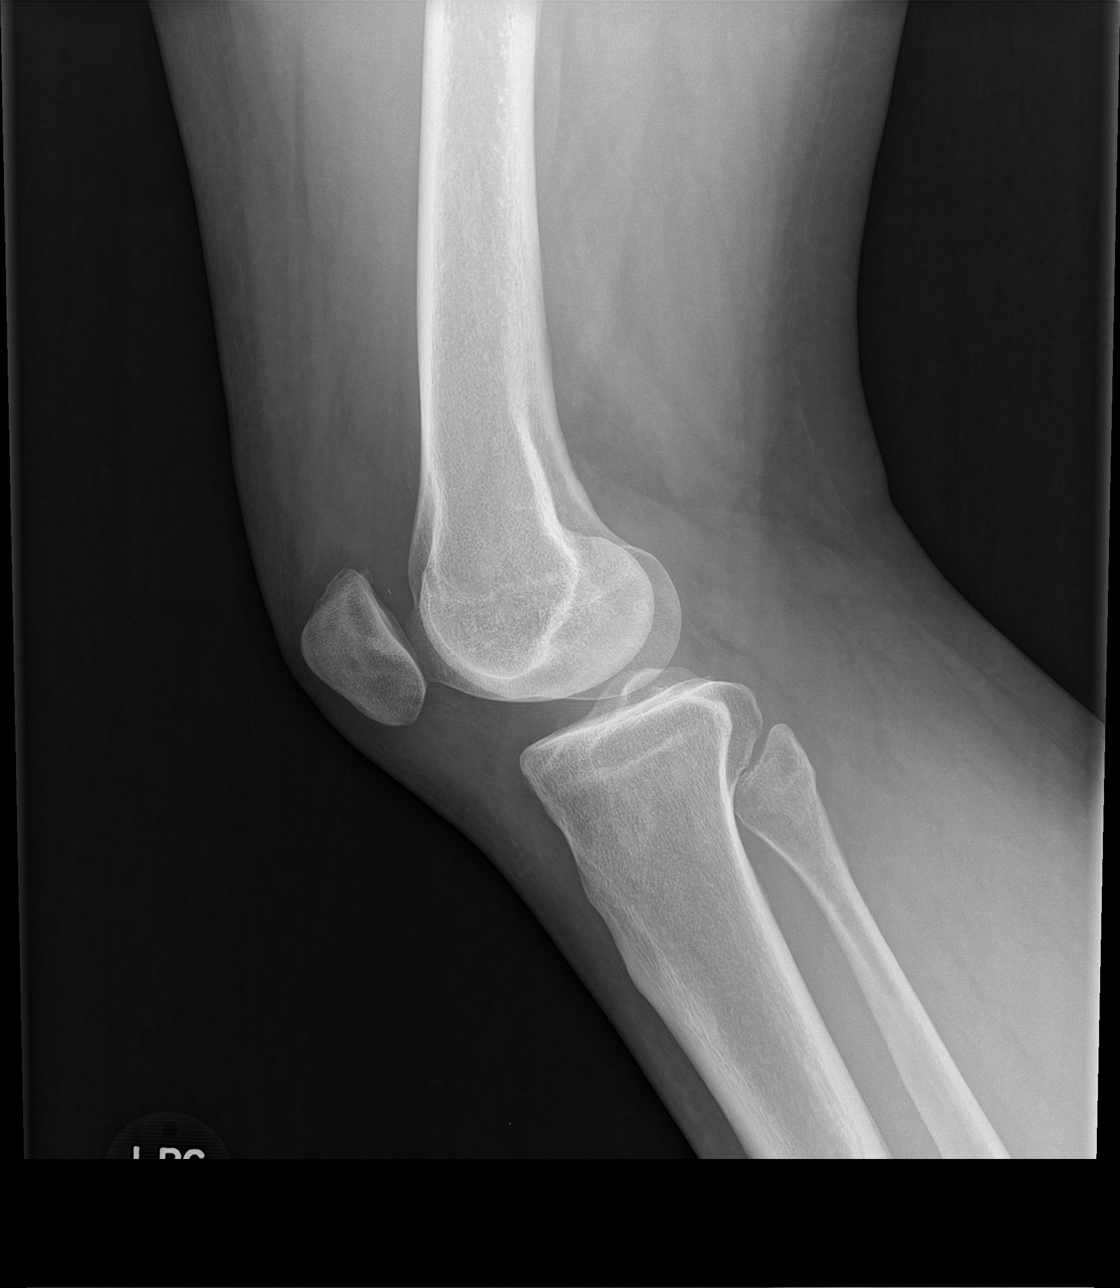

[4 of 4 positions shown; findings below may reference images not displayed]

FINDINGS: Frontal, lateral, and bilateral oblique views were obtained. No
fracture or dislocation. No appreciable joint effusion. Joint spaces
appear unremarkable. No erosive change. There is minimal spurring
medially.
IMPRESSION: No fracture, dislocation, or joint effusion. Minimal spurring
medially. No joint space narrowing.
